# Patient Record
Sex: Male | Born: 1960 | Race: White | Hispanic: No | Smoking: Never smoker
Health system: Southern US, Community
[De-identification: ages and names within clinical notes are randomized; demographics above are authoritative.]

## PROBLEM LIST (undated history)

## (undated) DIAGNOSIS — E559 Vitamin D deficiency, unspecified: Secondary | ICD-10-CM

## (undated) DIAGNOSIS — K219 Gastro-esophageal reflux disease without esophagitis: Secondary | ICD-10-CM

## (undated) DIAGNOSIS — M1990 Unspecified osteoarthritis, unspecified site: Secondary | ICD-10-CM

## (undated) DIAGNOSIS — E785 Hyperlipidemia, unspecified: Secondary | ICD-10-CM

## (undated) DIAGNOSIS — H409 Unspecified glaucoma: Secondary | ICD-10-CM

## (undated) DIAGNOSIS — D649 Anemia, unspecified: Secondary | ICD-10-CM

## (undated) HISTORY — DX: Unspecified osteoarthritis, unspecified site: M19.90

## (undated) HISTORY — PX: SPINAL FUSION: SHX223

## (undated) HISTORY — PX: HAND SURGERY: SHX662

## (undated) HISTORY — DX: Hyperlipidemia, unspecified: E78.5

## (undated) HISTORY — PX: RHINOPLASTY: SUR1284

## (undated) HISTORY — DX: Gastro-esophageal reflux disease without esophagitis: K21.9

## (undated) HISTORY — DX: Unspecified glaucoma: H40.9

## (undated) HISTORY — PX: NECK SURGERY: SHX720

## (undated) HISTORY — DX: Anemia, unspecified: D64.9

## (undated) HISTORY — DX: Vitamin D deficiency, unspecified: E55.9

---

## 2001-08-12 ENCOUNTER — Encounter: Payer: Self-pay | Admitting: Neurosurgery

## 2001-08-12 ENCOUNTER — Encounter: Admission: RE | Admit: 2001-08-12 | Discharge: 2001-08-12 | Payer: Self-pay | Admitting: Neurosurgery

## 2001-08-23 ENCOUNTER — Encounter: Admission: RE | Admit: 2001-08-23 | Discharge: 2001-08-23 | Payer: Self-pay | Admitting: Neurosurgery

## 2001-08-23 ENCOUNTER — Encounter: Payer: Self-pay | Admitting: Neurosurgery

## 2002-01-10 ENCOUNTER — Encounter: Payer: Self-pay | Admitting: Neurosurgery

## 2002-01-10 ENCOUNTER — Encounter: Admission: RE | Admit: 2002-01-10 | Discharge: 2002-01-10 | Payer: Self-pay | Admitting: Neurosurgery

## 2002-03-21 ENCOUNTER — Encounter: Admission: RE | Admit: 2002-03-21 | Discharge: 2002-03-21 | Payer: Self-pay | Admitting: Neurosurgery

## 2002-03-21 ENCOUNTER — Encounter: Payer: Self-pay | Admitting: Neurosurgery

## 2002-04-01 ENCOUNTER — Encounter: Payer: Self-pay | Admitting: Neurosurgery

## 2002-04-01 ENCOUNTER — Encounter: Admission: RE | Admit: 2002-04-01 | Discharge: 2002-04-01 | Payer: Self-pay | Admitting: Neurosurgery

## 2002-07-28 ENCOUNTER — Ambulatory Visit (HOSPITAL_COMMUNITY): Admission: RE | Admit: 2002-07-28 | Discharge: 2002-07-29 | Payer: Self-pay | Admitting: Neurosurgery

## 2002-07-28 ENCOUNTER — Encounter: Payer: Self-pay | Admitting: Neurosurgery

## 2003-01-31 ENCOUNTER — Encounter: Admission: RE | Admit: 2003-01-31 | Discharge: 2003-01-31 | Payer: Self-pay | Admitting: Family Medicine

## 2003-01-31 ENCOUNTER — Encounter: Payer: Self-pay | Admitting: Family Medicine

## 2003-09-30 ENCOUNTER — Observation Stay (HOSPITAL_COMMUNITY): Admission: EM | Admit: 2003-09-30 | Discharge: 2003-10-01 | Payer: Self-pay | Admitting: Emergency Medicine

## 2003-11-07 ENCOUNTER — Ambulatory Visit (HOSPITAL_COMMUNITY): Admission: RE | Admit: 2003-11-07 | Discharge: 2003-11-07 | Payer: Self-pay | Admitting: Orthopedic Surgery

## 2003-11-07 ENCOUNTER — Ambulatory Visit (HOSPITAL_BASED_OUTPATIENT_CLINIC_OR_DEPARTMENT_OTHER): Admission: RE | Admit: 2003-11-07 | Discharge: 2003-11-07 | Payer: Self-pay | Admitting: Orthopedic Surgery

## 2004-03-08 ENCOUNTER — Ambulatory Visit (HOSPITAL_BASED_OUTPATIENT_CLINIC_OR_DEPARTMENT_OTHER): Admission: RE | Admit: 2004-03-08 | Discharge: 2004-03-08 | Payer: Self-pay | Admitting: *Deleted

## 2004-03-08 ENCOUNTER — Ambulatory Visit (HOSPITAL_COMMUNITY): Admission: RE | Admit: 2004-03-08 | Discharge: 2004-03-08 | Payer: Self-pay | Admitting: *Deleted

## 2005-04-22 ENCOUNTER — Ambulatory Visit (HOSPITAL_COMMUNITY): Admission: RE | Admit: 2005-04-22 | Discharge: 2005-04-22 | Payer: Self-pay | Admitting: Family Medicine

## 2011-11-05 ENCOUNTER — Encounter: Payer: Self-pay | Admitting: Internal Medicine

## 2011-11-13 ENCOUNTER — Encounter: Payer: Self-pay | Admitting: Internal Medicine

## 2011-11-14 ENCOUNTER — Encounter: Payer: Self-pay | Admitting: Internal Medicine

## 2011-11-14 ENCOUNTER — Ambulatory Visit (INDEPENDENT_AMBULATORY_CARE_PROVIDER_SITE_OTHER): Payer: 59 | Admitting: Internal Medicine

## 2011-11-14 VITALS — BP 120/64 | HR 80 | Ht 70.0 in | Wt 182.0 lb

## 2011-11-14 DIAGNOSIS — R195 Other fecal abnormalities: Secondary | ICD-10-CM

## 2011-11-14 DIAGNOSIS — D509 Iron deficiency anemia, unspecified: Secondary | ICD-10-CM

## 2011-11-14 DIAGNOSIS — K219 Gastro-esophageal reflux disease without esophagitis: Secondary | ICD-10-CM

## 2011-11-14 DIAGNOSIS — E785 Hyperlipidemia, unspecified: Secondary | ICD-10-CM | POA: Insufficient documentation

## 2011-11-14 MED ORDER — PEG-KCL-NACL-NASULF-NA ASC-C 100 G PO SOLR: 1.0000 | Freq: Once | ORAL | Status: DC

## 2011-11-14 NOTE — Patient Instructions (Signed)
You have been scheduled for a endoscopy/colonoscopy with propofol. Please follow written instructions given to you at your visit today.  Please pick up your prep kit at the pharmacy within the next 1-3 days.  We have sent the following medications to your pharmacy for you to pick up at your convenience: Moviprep, you were given instructions at your visit today.

## 2011-11-14 NOTE — Progress Notes (Signed)
Subjective:    Patient ID: Joseph Oliver, male    DOB: 01/24/1961, 51 y.o.   MRN: [id number]  HPI Joseph Oliver is a 51 yo male with PMH of GERD, hyperlipidemia and anemia who seen in consultation at request of Dr. Christell Constant for evaluation of positive FOBT. The patient also has a history of iron deficiency anemia, which she reports was diagnosed 2 years ago. He has been on oral iron replacement therapy for that time. I do not have his recent labs, but he reports improvement in his iron levels. He has not seen any evidence of blood in his stools. He denies diarrhea or constipation. He very rarely will have lower abdominal pain, which is usually relieved with defecation. He does have a history of heartburn for 7 or 8 years. This is usually well-controlled on Zantac 75 mg twice a day. He has no dysphagia, nausea or vomiting. No fevers or chills.  Review of Systems As per history of present illness, otherwise negative  Past Medical History  Diagnosis Date  . Anemia     Hx of   . GERD (gastroesophageal reflux disease)   . Hyperlipemia    Past Surgical History  Procedure Date  . Rhinoplasty   . Spinal fusion   . Neck surgery   . Hand surgery     Left    Current Outpatient Prescriptions  Medication Sig Dispense Refill  . calcium-vitamin D (OSCAL 500/200 D-3) 500-200 MG-UNIT per tablet Take 1 tablet by mouth daily.      . Ferrous Sulfate (IRON) 325 (65 FE) MG TABS Take 1 tablet by mouth daily.      . Multiple Vitamins-Minerals (CENTRUM SILVER PO) Take 1 capsule by mouth daily.      . ranitidine (ZANTAC) 75 MG tablet Take 75 mg by mouth 2 (two) times daily.      Marland Kitchen VITAMIN D, CHOLECALCIFEROL, PO Take by mouth as directed.      . peg 3350 powder (MOVIPREP) 100 G SOLR Take 1 kit (100 g total) by mouth once.  1 kit  0   No Known Allergies  Family History  Problem Relation Age of Onset  . Colon cancer Neg Hx    History  Substance Use Topics  . Smoking status: Never Smoker   . Smokeless  tobacco: Never Used  . Alcohol Use: Yes     3 drinks daily       Objective:   Physical Exam BP 120/64  Pulse 80  Ht 5\' 10"  (1.778 m)  Wt 182 lb (82.555 kg)  BMI 26.11 kg/m2 Constitutional: Well-developed and well-nourished. No distress. HEENT: Normocephalic and atraumatic. Oropharynx is clear and moist. No oropharyngeal exudate. Conjunctivae are normal. Pupils are equal round and reactive to light. No scleral icterus. Neck: Neck supple. Trachea midline. Cardiovascular: Normal rate, regular rhythm and intact distal pulses. No M/R/G Pulmonary/chest: Effort normal and breath sounds normal. No wheezing, rales or rhonchi. Abdominal: Soft, nontender, nondistended. Bowel sounds active throughout. There are no masses palpable. No hepatosplenomegaly. Extremities: no clubbing, cyanosis, or edema Lymphadenopathy: No cervical adenopathy noted. Neurological: Alert and oriented to person place and time. Skin: Skin is warm and dry. No rashes noted. Psychiatric: Normal mood and affect. Behavior is normal.     Assessment & Plan:   51 yo male with PMH of GERD, hyperlipidemia and anemia who seen in consultation at request of Dr. Christell Constant for evaluation of positive FOBT, also with hx of IDA (though records are pending).  1. +  FOBT -- this is age, his history of anemia which may be iron deficient, and his positive FOBT I recommended colonoscopy. We discussed the risks and benefits today and he is agreeable to proceed.  2. GERD -- patient has a long-standing history of GERD which is controlled at present on twice a day ranitidine therapy. He'll continue this therapy for now. Given his long-standing history of GERD, the fact that he is a middle-aged Caucasian male, we will perform upper endoscopy at the time of his colonoscopy for a Barrett's screening. Upper endoscopy would also be indicated given his iron deficiency anemia.

## 2011-11-17 ENCOUNTER — Encounter: Payer: Self-pay | Admitting: Internal Medicine

## 2011-11-26 ENCOUNTER — Ambulatory Visit: Payer: Self-pay | Admitting: Internal Medicine

## 2011-11-27 ENCOUNTER — Ambulatory Visit (AMBULATORY_SURGERY_CENTER): Payer: BC Managed Care – PPO | Admitting: Internal Medicine

## 2011-11-27 ENCOUNTER — Encounter: Payer: Self-pay | Admitting: Internal Medicine

## 2011-11-27 VITALS — BP 119/63 | HR 66 | Temp 98.4°F | Resp 12 | Ht 70.0 in | Wt 182.0 lb

## 2011-11-27 DIAGNOSIS — D509 Iron deficiency anemia, unspecified: Secondary | ICD-10-CM

## 2011-11-27 DIAGNOSIS — D1330 Benign neoplasm of unspecified part of small intestine: Secondary | ICD-10-CM

## 2011-11-27 DIAGNOSIS — K319 Disease of stomach and duodenum, unspecified: Secondary | ICD-10-CM

## 2011-11-27 DIAGNOSIS — K219 Gastro-esophageal reflux disease without esophagitis: Secondary | ICD-10-CM

## 2011-11-27 DIAGNOSIS — K2970 Gastritis, unspecified, without bleeding: Secondary | ICD-10-CM

## 2011-11-27 DIAGNOSIS — K635 Polyp of colon: Secondary | ICD-10-CM

## 2011-11-27 DIAGNOSIS — D126 Benign neoplasm of colon, unspecified: Secondary | ICD-10-CM

## 2011-11-27 MED ORDER — SODIUM CHLORIDE 0.9 % IV SOLN: 500.0000 mL | INTRAVENOUS | Status: DC

## 2011-11-27 NOTE — Op Note (Signed)
[facility] [address] [place], [place]  [address]  COLONOSCOPY PROCEDURE REPORT  PATIENT:  Joseph, Oliver  MR#:  [id number] BIRTHDATE:  [birth year], 50 yrs. old  GENDER:  male ENDOSCOPIST:  Carie Caddy. Ronny Ruddell, MD REF. BY:  Rudi Heap, M.D. PROCEDURE DATE:  11/27/2011 PROCEDURE:  Colonoscopy with snare polypectomy, Colon with cold biopsy polypectomy ASA CLASS:  Class I INDICATIONS:  FOBT positive stool, Iron deficiency anemia, 1st colonoscopy MEDICATIONS:   MAC sedation, administered by CRNA, propofol (Diprivan) 250 mg IV  DESCRIPTION OF PROCEDURE:   After the risks benefits and alternatives of the procedure were thoroughly explained, informed consent was obtained.  Digital rectal exam was performed and revealed no rectal masses.   The LB CF-H180AL [id number] endoscope was introduced through the anus and advanced to the terminal ileum which was intubated for a short distance, without limitations. The quality of the prep was good, using MoviPrep.  The instrument was then slowly withdrawn as the colon was fully examined. <<PROCEDUREIMAGES>>  FINDINGS:  The terminal ileum appeared normal.  A 6 mm sessile polyp was found in the cecum. Polyp was snared without cautery. Retrieval was successful.   A 5 mm sessile polyp was found in the transverse colon. Polyp was snared without cautery. Retrieval was successful. An 8 mm sessile polyp was found in the descending colon. Polyp was snared without cautery. Retrieval was successful. A 2 mm sessile polyp was found in the sigmoid colon. The polyp was removed using cold biopsy forceps.  A 6 mm pedunculated polyp was found in the sigmoid colon. Polyp was snared, then cauterized with monopolar cautery. Retrieval was successful. Retroflexed views in the rectum revealed no abnormalities.    The scope was then withdrawn from the cecum and the procedure completed.  COMPLICATIONS:  None  ENDOSCOPIC IMPRESSION: 1) Normal terminal  ileum 2) Sessile polyp in the cecum. Removed and sent to pathology. 3) Sessile polyp in the transverse colon. Removed and sent to pathology. 4) Sessile polyp in the descending colon. Removed and sent to pathology. 5) Sessile polyp in the sigmoid colon. Removed and sent to pathology. 6) Pedunculated polyp in the sigmoid colon. Removed and sent to pathology.  RECOMMENDATIONS: 1) Hold aspirin, aspirin products, and anti-inflammatory medication for 1 week. 2) Await pathology results 3) If the polyps removed today are proven to be adenomatous (pre-cancerous) polyps, you will need a colonoscopy in 3 years. Otherwise you should continue to follow colorectal cancer screening guidelines for "routine risk" patients with a colonoscopy in 10 years. You will receive a letter within 1-2 weeks with the results of your biopsy as well as final recommendations. Please call my office if you have not received a letter after 3 weeks.  Carie Caddy. Rhea Belton, MD  CC:  The Patient Rudi Heap, MD  n. Rosalie DoctorCarie Caddy. Zenola Dezarn at 11/27/2011 10:45 AM  Haynes Dage, [phone]

## 2011-11-27 NOTE — Op Note (Signed)
[facility] [address] [place], [place]  [address]  ENDOSCOPY PROCEDURE REPORT  PATIENT:  Joseph Oliver, Joseph Oliver  MR#:  [id number] BIRTHDATE:  1961/05/06, 50 yrs. old  GENDER:  male ENDOSCOPIST:  Carie Caddy. Pasqualino Witherspoon, MD Referred by:  Rudi Heap, M.D. PROCEDURE DATE:  11/27/2011 PROCEDURE:  EGD with biopsy for H. pylori 19147, EGD with biopsy, 43239 ASA CLASS:  Class I INDICATIONS:  GERD, iron deficiency anemia MEDICATIONS:   MAC sedation, administered by CRNA, propofol (Diprivan) 250 mg IV TOPICAL ANESTHETIC:  none  DESCRIPTION OF PROCEDURE:   After the risks benefits and alternatives of the procedure were thoroughly explained, informed consent was obtained.  The LB GIF-H180 [id number] endoscope was introduced through the mouth and advanced to the second portion of the duodenum, without limitations.  The instrument was slowly withdrawn as the mucosa was fully examined. <<PROCEDUREIMAGES>>  The esophagus and gastroesophageal junction were completely normal in appearance.  Mild gastritis with scattered erosions was found in the body and the antrum of the stomach. Biopsies of the antrum and body of the stomach were obtained and sent to pathology.  The duodenal bulb was normal in appearance, as was the postbulbar duodenum. Multiple biopsies were obtained and sent to pathology given iron deficiency.    Retroflexed views revealed no abnormalities.    The scope was then withdrawn from the patient and the procedure completed.  COMPLICATIONS:  None  ENDOSCOPIC IMPRESSION: 1) Normal esophagus 2) Mild gastritis with scattered erosions in the body and the antrum of the stomach.  Biopsies performed. 3) Normal duodenum. Biopsies performed.  RECOMMENDATIONS: 1) Await pathology results 2) Follow-up of helicobacter pylori status, treat if indicated 3) Avoid NSAIDs 4) Continue current medications.  Carie Caddy. Rhea Belton, MD  CC:  The Patient Rudi Heap, MD  n. Rosalie DoctorCarie Caddy. Edita Weyenberg  at 11/27/2011 10:39 AM  Haynes Dage, [phone]

## 2011-11-27 NOTE — Patient Instructions (Addendum)

## 2011-11-27 NOTE — Progress Notes (Signed)
Patient did not experience any of the following events: a burn prior to discharge; a fall within the facility; wrong site/side/patient/procedure/implant event; or a hospital transfer or hospital admission upon discharge from the facility. (G8907) Patient did not have preoperative order for IV antibiotic SSI prophylaxis. (G8918)  

## 2011-11-28 ENCOUNTER — Telehealth: Payer: Self-pay | Admitting: *Deleted

## 2011-11-28 NOTE — Telephone Encounter (Signed)
  Follow up Call-  Call back number 11/27/2011  Post procedure Call Back phone  # [phone]  Permission to leave phone message Yes     Patient questions:  Do you have a fever, pain , or abdominal swelling? no Pain Score  0 *  Have you tolerated food without any problems? yes  Have you been able to return to your normal activities? yes  Do you have any questions about your discharge instructions: Diet   no Medications  no Follow up visit  no  Do you have questions or concerns about your Care? no  Actions: * If pain score is 4 or above: No action needed, pain <4.

## 2011-12-05 ENCOUNTER — Telehealth: Payer: Self-pay | Admitting: Internal Medicine

## 2011-12-05 NOTE — Telephone Encounter (Signed)
Explained that Dr Rhea Belton is not here, but gave him my interpretation of the results. Informed him he will receive a letter from Dr Rhea Belton explaining why the polyps will need more surveillance as far the time for recall procedures; pt stated understanding.

## 2011-12-09 ENCOUNTER — Encounter: Payer: Self-pay | Admitting: Internal Medicine

## 2012-10-08 ENCOUNTER — Telehealth: Payer: Self-pay | Admitting: Nurse Practitioner

## 2012-10-08 NOTE — Telephone Encounter (Signed)
Sat appt made.

## 2012-10-09 ENCOUNTER — Ambulatory Visit (INDEPENDENT_AMBULATORY_CARE_PROVIDER_SITE_OTHER): Payer: BC Managed Care – PPO | Admitting: Nurse Practitioner

## 2012-10-09 ENCOUNTER — Encounter: Payer: Self-pay | Admitting: Nurse Practitioner

## 2012-10-09 VITALS — BP 112/73 | HR 55 | Temp 98.8°F | Ht 70.0 in | Wt 183.0 lb

## 2012-10-09 DIAGNOSIS — J209 Acute bronchitis, unspecified: Secondary | ICD-10-CM

## 2012-10-09 MED ORDER — AZITHROMYCIN 250 MG PO TABS: ORAL_TABLET | ORAL | Status: DC

## 2012-10-09 MED ORDER — HYDROCODONE-HOMATROPINE 5-1.5 MG/5ML PO SYRP: 5.0000 mL | ORAL_SOLUTION | Freq: Three times a day (TID) | ORAL | Status: DC | PRN

## 2012-10-09 NOTE — Progress Notes (Signed)
  Subjective:    Patient ID: Joseph Oliver, male    DOB: Aug 01, 1960, 52 y.o.   MRN: [id number]  HPI-Patient in complaining of couugh . Started 4day. Has gotten unchanged since started. Associated symptoms include Was on a plane with a lot of sick people. He has tried Mucinex OTC without relief.     Review of Systems  Constitutional: Negative for fever and chills.  HENT: Positive for congestion, rhinorrhea, postnasal drip and sinus pressure. Negative for ear pain.   Respiratory: Positive for cough (nonproductive).        Objective:   Physical Exam  Constitutional: He is oriented to person, place, and time. He appears well-developed and well-nourished.  HENT:  Right Ear: Tympanic membrane is not erythematous.  Left Ear: Tympanic membrane is not erythematous.  Nose: Mucosal edema and rhinorrhea present.  Mouth/Throat: Posterior oropharyngeal erythema present.  Cardiovascular: Normal rate and normal heart sounds.   Pulmonary/Chest: Effort normal. He has wheezes (when coughing).  Neurological: He is alert and oriented to person, place, and time.  Skin: Skin is warm and dry.   BP 112/73  Pulse 55  Temp(Src) 98.8 F (37.1 C) (Oral)  Ht 5\' 10"  (1.778 m)  Wt 183 lb (83.008 kg)  BMI 26.26 kg/m2        Assessment & Plan:  1. Acute bronchitis 1. Take meds as prescribed 2. Use a cool mist humidifier especially during the winter months and when heat has  been humid. 3. Use saline nose sprays frequently 4. Saline irrigations of the nose can be very helpful if done frequently.  * 4X daily for 1 week*  * Use of a nettie pot can be helpful with this. Follow directions with this* 5. Drink plenty of fluids 6. Keep thermostat turn down low 7.For any cough or congestion  Use plain Mucinex- regular strength or max strength is fine   * Children- consult with Pharmacist for dosing 8. For fever or aces or pains- take tylenol or ibuprofen appropriate for age and weight.  * for fevers  greater than 101 orally you may alternate ibuprofen and tylenol every  3 hours.   - azithromycin (ZITHROMAX Z-PAK) 250 MG tablet; As directed  Dispense: 6 each; Refill: 0 - HYDROcodone-homatropine (HYCODAN) 5-1.5 MG/5ML syrup; Take 5 mLs by mouth every 8 (eight) hours as needed for cough.  Dispense: 120 mL; Refill: 0  Mary-Margaret Daphine Deutscher, FNP

## 2012-10-09 NOTE — Patient Instructions (Signed)

## 2012-10-20 ENCOUNTER — Telehealth: Payer: Self-pay | Admitting: Nurse Practitioner

## 2012-10-20 ENCOUNTER — Ambulatory Visit (INDEPENDENT_AMBULATORY_CARE_PROVIDER_SITE_OTHER): Payer: BC Managed Care – PPO | Admitting: General Practice

## 2012-10-20 ENCOUNTER — Encounter: Payer: Self-pay | Admitting: General Practice

## 2012-10-20 VITALS — BP 115/68 | HR 73 | Temp 98.9°F | Ht 70.0 in | Wt 184.0 lb

## 2012-10-20 DIAGNOSIS — L0390 Cellulitis, unspecified: Secondary | ICD-10-CM

## 2012-10-20 DIAGNOSIS — W57XXXA Bitten or stung by nonvenomous insect and other nonvenomous arthropods, initial encounter: Secondary | ICD-10-CM

## 2012-10-20 DIAGNOSIS — L0291 Cutaneous abscess, unspecified: Secondary | ICD-10-CM

## 2012-10-20 DIAGNOSIS — T148 Other injury of unspecified body region: Secondary | ICD-10-CM

## 2012-10-20 MED ORDER — DOXYCYCLINE HYCLATE 100 MG PO TABS: ORAL_TABLET | ORAL | Status: DC

## 2012-10-20 NOTE — Telephone Encounter (Signed)
appt given for today with Philomena Doheny, FNP

## 2012-10-20 NOTE — Patient Instructions (Signed)
Deer Tick Bite Deer ticks are brown arachnids (spider family) that vary in size from as small as the head of a pin to 1/4 inch (1/2 cm) diameter. They thrive in wooded areas. Deer are the preferred host of adult deer ticks. Small rodents are the host of young ticks (nymphs). When a person walks in a field or wooded area, young and adult ticks in the surrounding grass and vegetation can attach themselves to the skin. They can suck blood for hours to days if unnoticed. Ticks are found all over the [place] Some ticks carry a specific bacteria (Borrelia burgdorferi) that causes an infection called Lyme disease. The bacteria is typically passed into a person during the blood sucking process. This happens after the tick has been attached for at least a number of hours. While ticks can be found all over the [place] those carrying the bacteria that causes Lyme disease are most common in [place] and the [place]. Only a small proportion of ticks in these areas carry the Lyme disease bacteria and cause human infections. Ticks usually attach to warm spots on the body, such as the:  Head.  Back.  Neck.  Armpits.  Groin. SYMPTOMS  Most of the time, a deer tick bite will not be felt. You may or may not see the attached tick. You may notice mild irritation or redness around the bite site. If the deer tick passes the Lyme disease bacteria to a person, a round, red rash may be noticed 2 to 3 days after the bite. The rash may be clear in the middle, like a bull's-eye or target. If not treated, other symptoms may develop several days to weeks after the onset of the rash. These symptoms may include:  New rash lesions.  Fatigue and weakness.  General ill feeling and achiness.  Chills.  Headache and neck pain.  Swollen lymph glands.  Sore muscles and joints. 5 to 15% of untreated people with Lyme disease may develop more severe illnesses after several weeks to months. This may include inflammation of the  brain lining (meningitis), nerve palsies, an abnormal heartbeat, or severe muscle and joint pain and inflammation (myositis or arthritis). DIAGNOSIS   Physical exam and medical history.  Viewing the tick if it was saved for confirmation.  Blood tests (to check or confirm the presence of Lyme disease). TREATMENT  Most ticks do not carry disease. If found, an attached tick should be removed using tweezers. Tweezers should be placed under the body of the tick so it is removed by its attachment parts (pincers). If there are signs or symptoms of being sick, or Lyme disease is confirmed, medicines (antibiotics) that kill germs are usually prescribed. In more severe cases, antibiotics may be given through an intravenous (IV) access. HOME CARE INSTRUCTIONS   Always remove ticks with tweezers. Do not use petroleum jelly or other methods to kill or remove the tick. Slide the tweezers under the body and pull out as much as you can. If you are not sure what it is, save it in a jar and show your caregiver.  Once you remove the tick, the skin will heal on its own. Wash your hands and the affected area with water and soap. You may place a bandage on the affected area.  Take medicine as directed. You may be advised to take a full course of antibiotics.  Follow up with your caregiver as recommended. FINDING OUT THE RESULTS OF YOUR TEST Not all test results are available   during your visit. If your test results are not back during the visit, make an appointment with your caregiver to find out the results. Do not assume everything is normal if you have not heard from your caregiver or the medical facility. It is important for you to follow up on all of your test results. PROGNOSIS  If Lyme disease is confirmed, early treatment with antibiotics is very effective. Following preventive guidelines is important since it is possible to get the disease more than once. PREVENTION   Wear long sleeves and long pants in  wooded or grassy areas. Tuck your pants into your socks.  Use an insect repellent while hiking.  Check yourself, your children, and your pets regularly for ticks after playing outside.  Clear piles of leaves or brush from your yard. Ticks might live there. SEEK MEDICAL CARE IF:   You or your child has an oral temperature above 102 F (38.9 C).  You develop a severe headache following the bite.  You feel generally ill.  You notice a rash.  You are having trouble removing the tick.  The bite area has red skin or yellow drainage. SEEK IMMEDIATE MEDICAL CARE IF:   Your face is weak and droopy or you have other neurological symptoms.  You have severe joint pain or weakness. MAKE SURE YOU:   Understand these instructions.  Will watch your condition.  Will get help right away if you are not doing well or get worse. FOR MORE INFORMATION Centers for Disease Control and Prevention: FootballExhibition.com.br American Academy [employer]: www.https://powers.com/ Document Released: 08/27/2009 Document Revised: 08/25/2011 Document Reviewed: 08/27/2009 [facility] Patient Information 2013 [place], [place]. Cellulitis Cellulitis is an infection of the skin and the tissue beneath it. The infected area is usually red and tender. Cellulitis occurs most often in the arms and lower legs.  CAUSES  Cellulitis is caused by bacteria that enter the skin through cracks or cuts in the skin. The most common types of bacteria that cause cellulitis are Staphylococcus and Streptococcus. SYMPTOMS   Redness and warmth.  Swelling.  Tenderness or pain.  Fever. DIAGNOSIS  Your caregiver can usually determine what is wrong based on a physical exam. Blood tests may also be done. TREATMENT  Treatment usually involves taking an antibiotic medicine. HOME CARE INSTRUCTIONS   Take your antibiotics as directed. Finish them even if you start to feel better.  Keep the infected arm or leg elevated to reduce  swelling.  Apply a warm cloth to the affected area up to 4 times per day to relieve pain.  Only take over-the-counter or prescription medicines for pain, discomfort, or fever as directed by your caregiver.  Keep all follow-up appointments as directed by your caregiver. SEEK MEDICAL CARE IF:   You notice red streaks coming from the infected area.  Your red area gets larger or turns dark in color.  Your bone or joint underneath the infected area becomes painful after the skin has healed.  Your infection returns in the same area or another area.  You notice a swollen bump in the infected area.  You develop new symptoms. SEEK IMMEDIATE MEDICAL CARE IF:   You have a fever.  You feel very sleepy.  You develop vomiting or diarrhea.  You have a general ill feeling (malaise) with muscle aches and pains. MAKE SURE YOU:   Understand these instructions.  Will watch your condition.  Will get help right away if you are not doing well or get worse. Document Released:  03/12/2005 Document Revised: 12/02/2011 Document Reviewed: 08/18/2011 [facility] Patient Information 2013 [place], [place].

## 2012-10-20 NOTE — Progress Notes (Signed)
  Subjective:    Patient ID: Joseph Oliver, male    DOB: 1961/01/29, 52 y.o.   MRN: [id number]  HPI Presents today for tick bite. Onset was Thursday of last week. Reports removing entire tick.     Review of Systems  Constitutional: Negative for fever and chills.  Respiratory: Negative for chest tightness and shortness of breath.   Cardiovascular: Negative for chest pain.  Genitourinary: Negative for difficulty urinating.  Skin:       Itching   Neurological: Negative for dizziness and headaches.       Objective:   Physical Exam  Constitutional: He is oriented to person, place, and time. He appears well-developed and well-nourished.  Cardiovascular: Normal rate, regular rhythm and normal heart sounds.   Pulmonary/Chest: Effort normal and breath sounds normal. No respiratory distress. He exhibits no tenderness.  Neurological: He is alert and oriented to person, place, and time.  Skin: Skin is warm and dry. There is erythema.  1/4 x 1/4 inch erythematous, firm area to right upper back  Psychiatric: He has a normal mood and affect.          Assessment & Plan:  Take antibiotics until completed Keep and dry, refrain from scratching RTO if symptoms worsen Patient verbalized understanding Coralie Keens, FNP-C

## 2012-11-23 ENCOUNTER — Telehealth: Payer: Self-pay | Admitting: Family Medicine

## 2012-11-23 DIAGNOSIS — E559 Vitamin D deficiency, unspecified: Secondary | ICD-10-CM

## 2012-11-23 DIAGNOSIS — E785 Hyperlipidemia, unspecified: Secondary | ICD-10-CM

## 2012-11-23 DIAGNOSIS — I10 Essential (primary) hypertension: Secondary | ICD-10-CM

## 2012-11-25 ENCOUNTER — Other Ambulatory Visit (INDEPENDENT_AMBULATORY_CARE_PROVIDER_SITE_OTHER): Payer: BC Managed Care – PPO

## 2012-11-25 DIAGNOSIS — E559 Vitamin D deficiency, unspecified: Secondary | ICD-10-CM

## 2012-11-25 DIAGNOSIS — E785 Hyperlipidemia, unspecified: Secondary | ICD-10-CM

## 2012-11-25 DIAGNOSIS — Z0000 Encounter for general adult medical examination without abnormal findings: Secondary | ICD-10-CM

## 2012-11-25 DIAGNOSIS — Z79899 Other long term (current) drug therapy: Secondary | ICD-10-CM

## 2012-11-25 LAB — POCT CBC
Granulocyte percent: 57.1 %G (ref 37–80)
Hemoglobin: 15.3 g/dL (ref 14.1–18.1)
Lymph, poc: 2.5 (ref 0.6–3.4)
MPV: 8.2 fL (ref 0–99.8)
POC Granulocyte: 4.6 (ref 2–6.9)
POC LYMPH PERCENT: 30.5 %L (ref 10–50)
Platelet Count, POC: 257 10*3/uL (ref 142–424)
RBC: 5.1 M/uL (ref 4.69–6.13)

## 2012-11-25 LAB — BASIC METABOLIC PANEL WITH GFR
CO2: 29 mEq/L (ref 19–32)
Calcium: 9.5 mg/dL (ref 8.4–10.5)
Chloride: 104 mEq/L (ref 96–112)
Creat: 1.01 mg/dL (ref 0.50–1.35)
GFR, Est Non African American: 85 mL/min
Sodium: 142 mEq/L (ref 135–145)

## 2012-11-25 LAB — HEPATIC FUNCTION PANEL
Alkaline Phosphatase: 71 U/L (ref 39–117)
Indirect Bilirubin: 0.5 mg/dL (ref 0.0–0.9)
Total Bilirubin: 0.6 mg/dL (ref 0.3–1.2)

## 2012-11-25 NOTE — Progress Notes (Signed)
Pt here prior to visit

## 2012-11-26 LAB — NMR LIPOPROFILE WITH LIPIDS
HDL Particle Number: 37.7 umol/L (ref >=30.5)
HDL Size: 8.6 nm — ABNORMAL LOW (ref >=9.2)
HDL-C: 59 mg/dL (ref >=40)
LDL (calc): 137 mg/dL — ABNORMAL HIGH (ref <100)
LDL Size: 21.2 nm (ref >20.5)
LP-IR Score: 49 — ABNORMAL HIGH (ref <=45)
Large HDL-P: 3.1 umol/L — ABNORMAL LOW (ref >=4.8)
Triglycerides: 64 mg/dL (ref <150)

## 2012-11-26 LAB — VITAMIN D 25 HYDROXY (VIT D DEFICIENCY, FRACTURES): Vit D, 25-Hydroxy: 66 ng/mL (ref 30–89)

## 2012-12-01 NOTE — Telephone Encounter (Signed)
Pt aware of labs  

## 2012-12-01 NOTE — Telephone Encounter (Signed)
orders put in for labs

## 2012-12-06 ENCOUNTER — Encounter: Payer: Self-pay | Admitting: *Deleted

## 2012-12-06 ENCOUNTER — Encounter: Payer: Self-pay | Admitting: General Practice

## 2012-12-06 ENCOUNTER — Ambulatory Visit (INDEPENDENT_AMBULATORY_CARE_PROVIDER_SITE_OTHER): Payer: BC Managed Care – PPO | Admitting: General Practice

## 2012-12-06 ENCOUNTER — Telehealth: Payer: Self-pay | Admitting: General Practice

## 2012-12-06 VITALS — BP 115/73 | HR 61 | Temp 98.5°F | Ht 70.0 in | Wt 187.5 lb

## 2012-12-06 DIAGNOSIS — S30861A Insect bite (nonvenomous) of abdominal wall, initial encounter: Secondary | ICD-10-CM

## 2012-12-06 DIAGNOSIS — S30860A Insect bite (nonvenomous) of lower back and pelvis, initial encounter: Secondary | ICD-10-CM

## 2012-12-06 DIAGNOSIS — W57XXXA Bitten or stung by nonvenomous insect and other nonvenomous arthropods, initial encounter: Secondary | ICD-10-CM

## 2012-12-06 NOTE — Telephone Encounter (Signed)
appt made

## 2012-12-06 NOTE — Patient Instructions (Addendum)
Deer Tick Bite Deer ticks are brown arachnids (spider family) that vary in size from as small as the head of a pin to 1/4 inch (1/2 cm) diameter. They thrive in wooded areas. Deer are the preferred host of adult deer ticks. Small rodents are the host of young ticks (nymphs). When a person walks in a field or wooded area, young and adult ticks in the surrounding grass and vegetation can attach themselves to the skin. They can suck blood for hours to days if unnoticed. Ticks are found all over the [place] Some ticks carry a specific bacteria (Borrelia burgdorferi) that causes an infection called Lyme disease. The bacteria is typically passed into a person during the blood sucking process. This happens after the tick has been attached for at least a number of hours. While ticks can be found all over the [place] those carrying the bacteria that causes Lyme disease are most common in [place] and the [place]. Only a small proportion of ticks in these areas carry the Lyme disease bacteria and cause human infections. Ticks usually attach to warm spots on the body, such as the:  Head.  Back.  Neck.  Armpits.  Groin. SYMPTOMS  Most of the time, a deer tick bite will not be felt. You may or may not see the attached tick. You may notice mild irritation or redness around the bite site. If the deer tick passes the Lyme disease bacteria to a person, a round, red rash may be noticed 2 to 3 days after the bite. The rash may be clear in the middle, like a bull's-eye or target. If not treated, other symptoms may develop several days to weeks after the onset of the rash. These symptoms may include:  New rash lesions.  Fatigue and weakness.  General ill feeling and achiness.  Chills.  Headache and neck pain.  Swollen lymph glands.  Sore muscles and joints. 5 to 15% of untreated people with Lyme disease may develop more severe illnesses after several weeks to months. This may include inflammation of the  brain lining (meningitis), nerve palsies, an abnormal heartbeat, or severe muscle and joint pain and inflammation (myositis or arthritis). DIAGNOSIS   Physical exam and medical history.  Viewing the tick if it was saved for confirmation.  Blood tests (to check or confirm the presence of Lyme disease). TREATMENT  Most ticks do not carry disease. If found, an attached tick should be removed using tweezers. Tweezers should be placed under the body of the tick so it is removed by its attachment parts (pincers). If there are signs or symptoms of being sick, or Lyme disease is confirmed, medicines (antibiotics) that kill germs are usually prescribed. In more severe cases, antibiotics may be given through an intravenous (IV) access. HOME CARE INSTRUCTIONS   Always remove ticks with tweezers. Do not use petroleum jelly or other methods to kill or remove the tick. Slide the tweezers under the body and pull out as much as you can. If you are not sure what it is, save it in a jar and show your caregiver.  Once you remove the tick, the skin will heal on its own. Wash your hands and the affected area with water and soap. You may place a bandage on the affected area.  Take medicine as directed. You may be advised to take a full course of antibiotics.  Follow up with your caregiver as recommended. FINDING OUT THE RESULTS OF YOUR TEST Not all test results are available   during your visit. If your test results are not back during the visit, make an appointment with your caregiver to find out the results. Do not assume everything is normal if you have not heard from your caregiver or the medical facility. It is important for you to follow up on all of your test results. PROGNOSIS  If Lyme disease is confirmed, early treatment with antibiotics is very effective. Following preventive guidelines is important since it is possible to get the disease more than once. PREVENTION   Wear long sleeves and long pants in  wooded or grassy areas. Tuck your pants into your socks.  Use an insect repellent while hiking.  Check yourself, your children, and your pets regularly for ticks after playing outside.  Clear piles of leaves or brush from your yard. Ticks might live there. SEEK MEDICAL CARE IF:   You or your child has an oral temperature above 102 F (38.9 C).  You develop a severe headache following the bite.  You feel generally ill.  You notice a rash.  You are having trouble removing the tick.  The bite area has red skin or yellow drainage. SEEK IMMEDIATE MEDICAL CARE IF:   Your face is weak and droopy or you have other neurological symptoms.  You have severe joint pain or weakness. MAKE SURE YOU:   Understand these instructions.  Will watch your condition.  Will get help right away if you are not doing well or get worse. FOR MORE INFORMATION Centers for Disease Control and Prevention: www.cdc.gov American Academy [employer]icians: www.aafp.org Document Released: 08/27/2009 Document Revised: 08/25/2011 Document Reviewed: 08/27/2009 [employer] Patient Information 2014 [employer].  

## 2012-12-06 NOTE — Progress Notes (Addendum)
  Subjective:    Patient ID: Joseph Oliver, male    DOB: 05/25/1961, 52 y.o.   MRN: [id number]  HPI Presents today for follow up of tick bite on Sunday. He reports his girlfriend attempted to remove tick and wasn't able to remove fully intact. He reports the tick was in his umbilical area. He reports beginning starting doxycycline on Sunday, it was a prescription that was written a few weeks ago for tick bite that he never took.     Review of Systems  Constitutional: Negative for fever and chills.  Respiratory: Negative for chest tightness and shortness of breath.   Cardiovascular: Negative for chest pain and palpitations.  Gastrointestinal: Negative for abdominal pain.  Genitourinary: Negative for dysuria, hematuria and difficulty urinating.  Skin:       Pinpoint red area noted to umbilical area  Neurological: Negative for dizziness, weakness and headaches.       Objective:   Physical Exam  Constitutional: He is oriented to person, place, and time. He appears well-developed and well-nourished.  Cardiovascular: Normal rate, regular rhythm and normal heart sounds.   Pulmonary/Chest: Effort normal and breath sounds normal. No respiratory distress. He exhibits no tenderness.  Neurological: He is alert and oriented to person, place, and time.  Skin: Skin is warm and dry.  Pinpoint erythematous area noted to umbilical area. Negative for drainage.  Psychiatric: He has a normal mood and affect.          Assessment & Plan:  1. Tick bite of abdomen, initial encounter -Cleansed umbilical area with alcohol, removed remainder of tick, cleansed again with normal saline/peroxide, patient tolerated well -Continue doxycycline as directed -keep area clean and dry -Monitor for signs of infection -RTO if symptoms worsen or seek emergency medical treatment -Patient verbalized understanding -Coralie Keens, FNP-C

## 2012-12-14 ENCOUNTER — Encounter: Payer: Self-pay | Admitting: *Deleted

## 2012-12-24 NOTE — Telephone Encounter (Signed)
A BMP and a hepatic lab was ordered on 11-24-10 and a final has been sent

## 2013-01-18 ENCOUNTER — Telehealth: Payer: Self-pay | Admitting: Family Medicine

## 2013-01-18 NOTE — Telephone Encounter (Signed)
appt made

## 2013-01-19 ENCOUNTER — Encounter: Payer: Self-pay | Admitting: Family Medicine

## 2013-01-19 ENCOUNTER — Ambulatory Visit (INDEPENDENT_AMBULATORY_CARE_PROVIDER_SITE_OTHER): Payer: BC Managed Care – PPO | Admitting: Family Medicine

## 2013-01-19 ENCOUNTER — Ambulatory Visit (INDEPENDENT_AMBULATORY_CARE_PROVIDER_SITE_OTHER): Payer: BC Managed Care – PPO

## 2013-01-19 VITALS — BP 128/77 | HR 55 | Temp 98.1°F | Ht 69.0 in | Wt 185.8 lb

## 2013-01-19 DIAGNOSIS — M79609 Pain in unspecified limb: Secondary | ICD-10-CM

## 2013-01-19 DIAGNOSIS — F411 Generalized anxiety disorder: Secondary | ICD-10-CM

## 2013-01-19 DIAGNOSIS — M79672 Pain in left foot: Secondary | ICD-10-CM

## 2013-01-19 MED ORDER — MELOXICAM 15 MG PO TABS: 15.0000 mg | ORAL_TABLET | Freq: Every day | ORAL | Status: DC | PRN

## 2013-01-19 NOTE — Patient Instructions (Signed)
Use warm wet compresses to left heel Get a heel cup Take meloxicam daily for one to 2 weeks Protect stomach while taking meloxicam If problems continue get back in touch with my nurse and will set up a visit with an orthopedist to potentially inject the heel Bring back FOBT and remember to check this regularly Check with insurance company regarding the shingles shot

## 2013-01-19 NOTE — Progress Notes (Signed)
  Subjective:    Patient ID: Joseph Oliver, male    DOB: [birth year], 52 y.o.   MRN: [id number]  HPI Patient comes in today complaining of left heel pain for about one month . According to his health maintenance issues he is due a Tdap. He also needs an FOBT and should check with his insurance company to see if they will allow him to get a zostavax.   Review of Systems  Constitutional: Negative.  Negative for activity change.  HENT: Negative.   Eyes: Negative.   Respiratory: Negative.   Cardiovascular: Negative.   Gastrointestinal: Negative.   Endocrine: Negative.   Genitourinary: Negative.   Musculoskeletal: Positive for arthralgias (L heel x 1 mth, NKI).  Neurological: Negative.   Psychiatric/Behavioral: Negative.        Objective:   Physical Exam  Constitutional: He is oriented to person, place, and time. He appears well-developed and well-nourished. No distress.  HENT:  Head: Normocephalic and atraumatic.  Eyes: Conjunctivae are normal. No scleral icterus.  Neck: Normal range of motion.  Musculoskeletal: Normal range of motion. He exhibits tenderness (left plantar heel tender). He exhibits no edema.  Neurological: He is alert and oriented to person, place, and time.  Psychiatric: He has a normal mood and affect. His behavior is normal. Judgment and thought content normal.          Assessment & Plan:  1. Heel pain, left secondary to spur - DG Os Calcis Left  2. History of precancerous colon polyp -Yearly FOBT  3. Check with insurance regarding Zostavax    Patient Instructions  Use warm wet compresses to left heel Get a heel cup Take meloxicam daily for one to 2 weeks Protect stomach while taking meloxicam If problems continue get back in touch with my nurse and will set up a visit with an orthopedist to potentially inject the heel Bring back FOBT and remember to check this regularly Check with insurance company regarding the shingles shot   We will refill  meloxicam and Xanax periodically as needed for [place] flights  Nyra Capes MD

## 2013-01-27 ENCOUNTER — Other Ambulatory Visit (INDEPENDENT_AMBULATORY_CARE_PROVIDER_SITE_OTHER): Payer: BC Managed Care – PPO

## 2013-01-27 DIAGNOSIS — Z1212 Encounter for screening for malignant neoplasm of rectum: Secondary | ICD-10-CM

## 2013-01-28 LAB — SPECIMEN STATUS REPORT

## 2013-01-28 LAB — FECAL OCCULT BLOOD, IMMUNOCHEMICAL

## 2013-02-16 NOTE — Addendum Note (Signed)
Addended by: Lisbeth Ply C on: 02/16/2013 10:45 AM   Modules accepted: Orders

## 2013-02-24 ENCOUNTER — Encounter: Payer: Self-pay | Admitting: *Deleted

## 2013-04-07 ENCOUNTER — Telehealth: Payer: Self-pay | Admitting: Family Medicine

## 2013-04-07 MED ORDER — METHOCARBAMOL 500 MG PO TABS: 500.0000 mg | ORAL_TABLET | Freq: Every day | ORAL | Status: DC | PRN

## 2013-04-07 NOTE — Telephone Encounter (Signed)
This is okay to refill this medication for 3 months 

## 2013-04-07 NOTE — Telephone Encounter (Signed)
Pt notified rx methocarbamol called to [facility]

## 2013-04-29 ENCOUNTER — Telehealth: Payer: Self-pay | Admitting: Family Medicine

## 2013-05-02 ENCOUNTER — Other Ambulatory Visit: Payer: Self-pay | Admitting: *Deleted

## 2013-05-02 NOTE — Telephone Encounter (Signed)
He is okay to refill both of these medications as he is about to run out of them.

## 2013-05-03 NOTE — Telephone Encounter (Signed)
Spoke with patient regarding prescriptions Robaxin needs to be increased to BID per pt Rx now written for only 1 tablet daily Please advise

## 2013-05-03 NOTE — Telephone Encounter (Signed)
RX changed per [facility] and called into [facility] Pt notified

## 2013-05-03 NOTE — Telephone Encounter (Signed)
Take one twice a day instead of one daily as needed on the methocarbamol

## 2013-05-30 MED ORDER — MELOXICAM 15 MG PO TABS: 15.0000 mg | ORAL_TABLET | Freq: Every day | ORAL | Status: DC | PRN

## 2013-07-01 ENCOUNTER — Other Ambulatory Visit: Payer: Self-pay | Admitting: Family Medicine

## 2013-07-18 ENCOUNTER — Telehealth: Payer: Self-pay | Admitting: Family Medicine

## 2013-07-18 ENCOUNTER — Encounter: Payer: Self-pay | Admitting: Family Medicine

## 2013-07-18 ENCOUNTER — Ambulatory Visit (INDEPENDENT_AMBULATORY_CARE_PROVIDER_SITE_OTHER): Payer: BC Managed Care – PPO | Admitting: Family Medicine

## 2013-07-18 VITALS — BP 130/81 | HR 67 | Temp 98.8°F | Ht 69.0 in | Wt 192.0 lb

## 2013-07-18 DIAGNOSIS — Z23 Encounter for immunization: Secondary | ICD-10-CM

## 2013-07-18 DIAGNOSIS — S8010XA Contusion of unspecified lower leg, initial encounter: Secondary | ICD-10-CM

## 2013-07-18 DIAGNOSIS — G4700 Insomnia, unspecified: Secondary | ICD-10-CM

## 2013-07-18 MED ORDER — ALPRAZOLAM 0.5 MG PO TABS: 0.5000 mg | ORAL_TABLET | Freq: Every evening | ORAL | Status: DC | PRN

## 2013-07-18 NOTE — Telephone Encounter (Signed)
Appt given for today 

## 2013-07-18 NOTE — Progress Notes (Signed)
   Subjective:    Patient ID: Joseph Oliver, male    DOB: February 28, 1961, 53 y.o.   MRN: [id number]  HPI This 53 y.o. male presents for evaluation of contusion to right shin and discomfort.  He has A large hematoma that has been draining and now the area surrounding the abrasion and contusion Has discomfort.  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   Review of Systems C/o contusion No chest pain, SOB, HA, dizziness, vision change, N/V, diarrhea, constipation, dysuria, urinary urgency or frequency, myalgias, arthralgias or rash.     Objective:   Physical Exam  Right shin  - Area of ecchymosis on right shin approx 6cm diameter and with central abrasion                     No signs of cellulitis or swelling.       Assessment & Plan:  Need for prophylactic vaccination and inoculation against influenza  Contusion - Apply heating pad and continue meloxicam and reassurance given.  Insomnia - Xanax 05mg  #30 no refill.  Lysbeth Penner FNP

## 2014-05-26 ENCOUNTER — Telehealth: Payer: Self-pay | Admitting: Family Medicine

## 2014-05-26 ENCOUNTER — Other Ambulatory Visit: Payer: Self-pay | Admitting: Family Medicine

## 2014-05-26 MED ORDER — METHOCARBAMOL 500 MG PO TABS: 500.0000 mg | ORAL_TABLET | Freq: Two times a day (BID) | ORAL | Status: DC

## 2014-05-26 MED ORDER — MELOXICAM 15 MG PO TABS: 15.0000 mg | ORAL_TABLET | Freq: Every day | ORAL | Status: DC | PRN

## 2014-05-26 NOTE — Telephone Encounter (Signed)
Pt requesting refill on his meloxicam and robaxin, last seen by bill 07/18/13, no follow up appts scheduled, meloxicam last ordered 05/02/13 1 po qd prn #30 no refills, robaxin last ordered 07/01/13  1po bid #60 with 1 refill. If approved will be sent to pharmacy.

## 2014-05-26 NOTE — Telephone Encounter (Signed)
meds sent to pharm

## 2014-05-29 NOTE — Telephone Encounter (Signed)
Aware ,scripts sent in.

## 2014-06-12 ENCOUNTER — Encounter: Payer: Self-pay | Admitting: Family Medicine

## 2014-06-12 ENCOUNTER — Ambulatory Visit (INDEPENDENT_AMBULATORY_CARE_PROVIDER_SITE_OTHER): Payer: BC Managed Care – PPO | Admitting: Family Medicine

## 2014-06-12 ENCOUNTER — Encounter (INDEPENDENT_AMBULATORY_CARE_PROVIDER_SITE_OTHER): Payer: Self-pay

## 2014-06-12 ENCOUNTER — Ambulatory Visit (INDEPENDENT_AMBULATORY_CARE_PROVIDER_SITE_OTHER): Payer: BC Managed Care – PPO | Admitting: *Deleted

## 2014-06-12 ENCOUNTER — Other Ambulatory Visit: Payer: Self-pay | Admitting: Family Medicine

## 2014-06-12 VITALS — BP 117/73 | HR 72 | Temp 98.6°F | Ht 69.0 in | Wt 192.4 lb

## 2014-06-12 DIAGNOSIS — IMO0002 Reserved for concepts with insufficient information to code with codable children: Secondary | ICD-10-CM

## 2014-06-12 DIAGNOSIS — R229 Localized swelling, mass and lump, unspecified: Principal | ICD-10-CM

## 2014-06-12 DIAGNOSIS — N63 Unspecified lump in unspecified breast: Secondary | ICD-10-CM

## 2014-06-12 DIAGNOSIS — Z23 Encounter for immunization: Secondary | ICD-10-CM

## 2014-06-12 NOTE — Progress Notes (Signed)
   Subjective:    Patient ID: Joseph Oliver, male    DOB: 03-08-1961, 53 y.o.   MRN: [id number]  HPI C/o left breast mass.  Review of Systems  Constitutional: Negative for fever.  HENT: Negative for ear pain.   Eyes: Negative for discharge.  Respiratory: Negative for cough.   Cardiovascular: Negative for chest pain.  Gastrointestinal: Negative for abdominal distention.  Endocrine: Negative for polyuria.  Genitourinary: Negative for difficulty urinating.  Musculoskeletal: Negative for gait problem and neck pain.  Skin: Negative for color change and rash.  Neurological: Negative for speech difficulty and headaches.  Psychiatric/Behavioral: Negative for agitation.       Objective:    BP 117/73 mmHg  Pulse 72  Temp(Src) 98.6 F (37 C) (Oral)  Ht 5\' 9"  (1.753 m)  Wt 192 lb 6.4 oz (87.272 kg)  BMI 28.40 kg/m2 Physical Exam  Left breast - Above areola a 1cm diameter mass is palpated and is nontender      Assessment & Plan:     ICD-9-CM ICD-10-CM   1. Breast mass in male 611.72 N63 MM Digital Diagnostic Unilat L     No Follow-up on file.  Lysbeth Penner FNP

## 2014-06-16 HISTORY — PX: COLONOSCOPY: SHX174

## 2014-06-25 ENCOUNTER — Other Ambulatory Visit: Payer: Self-pay | Admitting: Family Medicine

## 2014-06-26 NOTE — Telephone Encounter (Signed)
Last seen 06/12/14  B [place]  This med not on EPIC med list

## 2014-07-04 ENCOUNTER — Ambulatory Visit
Admission: RE | Admit: 2014-07-04 | Discharge: 2014-07-04 | Disposition: A | Discharge status: Home / Self Care | Payer: BLUE CROSS/BLUE SHIELD | Source: Ambulatory Visit | Attending: Family Medicine | Admitting: Family Medicine

## 2014-07-04 DIAGNOSIS — N63 Unspecified lump in unspecified breast: Secondary | ICD-10-CM

## 2014-07-04 DIAGNOSIS — R229 Localized swelling, mass and lump, unspecified: Principal | ICD-10-CM

## 2014-07-04 DIAGNOSIS — IMO0002 Reserved for concepts with insufficient information to code with codable children: Secondary | ICD-10-CM

## 2014-07-18 ENCOUNTER — Telehealth: Payer: Self-pay | Admitting: Family Medicine

## 2014-07-19 ENCOUNTER — Ambulatory Visit (INDEPENDENT_AMBULATORY_CARE_PROVIDER_SITE_OTHER): Payer: BLUE CROSS/BLUE SHIELD | Admitting: Nurse Practitioner

## 2014-07-19 ENCOUNTER — Encounter: Payer: Self-pay | Admitting: Nurse Practitioner

## 2014-07-19 VITALS — BP 147/84 | HR 75 | Temp 99.7°F | Ht 69.0 in | Wt 189.0 lb

## 2014-07-19 DIAGNOSIS — J0101 Acute recurrent maxillary sinusitis: Secondary | ICD-10-CM

## 2014-07-19 MED ORDER — AZITHROMYCIN 250 MG PO TABS: ORAL_TABLET | ORAL | Status: DC

## 2014-07-19 MED ORDER — HYDROCOD POLST-CHLORPHEN POLST 10-8 MG/5ML PO LQCR: 5.0000 mL | Freq: Two times a day (BID) | ORAL | Status: DC

## 2014-07-19 NOTE — Patient Instructions (Signed)

## 2014-07-19 NOTE — Progress Notes (Signed)
   Subjective:    Patient ID: Joseph Oliver, male    DOB: 11/18/1960, 54 y.o.   MRN: [id number]  HPI Patient is in today c/o cough and sore throat- feels run down. Started Sunday night- has had a low grade fever off and on.  Feels somewhat fatigued.    Review of Systems  Constitutional: Positive for fever and fatigue.  HENT: Positive for congestion and rhinorrhea.   Respiratory: Positive for cough.   Cardiovascular: Negative.   Gastrointestinal: Negative.   Genitourinary: Negative.   Neurological: Negative.   Psychiatric/Behavioral: Negative.   All other systems reviewed and are negative.      Objective:   Physical Exam  Constitutional: He is oriented to person, place, and time. He appears well-developed and well-nourished.  HENT:  Right Ear: Hearing, tympanic membrane, external ear and ear canal normal.  Left Ear: Hearing, tympanic membrane, external ear and ear canal normal.  Nose: Mucosal edema and rhinorrhea present. Right sinus exhibits maxillary sinus tenderness. Right sinus exhibits no frontal sinus tenderness. Left sinus exhibits maxillary sinus tenderness. Left sinus exhibits no frontal sinus tenderness.  Mouth/Throat: Uvula is midline, oropharynx is clear and moist and mucous membranes are normal.  Eyes: Pupils are equal, round, and reactive to light.  Neck: Normal range of motion. Neck supple.  Cardiovascular: Normal rate, regular rhythm and normal heart sounds.   Pulmonary/Chest: Effort normal and breath sounds normal.  Lymphadenopathy:    He has no cervical adenopathy.  Neurological: He is alert and oriented to person, place, and time.  Skin: Skin is warm.  Psychiatric: He has a normal mood and affect. His behavior is normal. Judgment and thought content normal.   BP 147/84 mmHg  Pulse 75  Temp(Src) 99.7 F (37.6 C) (Oral)  Ht 5\' 9"  (1.753 m)  Wt 189 lb (85.73 kg)  BMI 27.90 kg/m2        Assessment & Plan:   1. Acute recurrent maxillary sinusitis      Meds ordered this encounter  Medications  . azithromycin (ZITHROMAX Z-PAK) 250 MG tablet    Sig: As directed    Dispense:  6 each    Refill:  0    Order Specific Question:  Supervising Provider    Answer:  Chipper Herb [[id number]]  . chlorpheniramine-HYDROcodone (TUSSIONEX PENNKINETIC ER) 10-8 MG/5ML LQCR    Sig: Take 5 mLs by mouth 2 (two) times daily.    Dispense:  115 mL    Refill:  0    Order Specific Question:  Supervising Provider    Answer:  Chipper Herb [[id number]]   1. Take meds as prescribed 2. Use a cool mist humidifier especially during the winter months and when heat has been humid. 3. Use saline nose sprays frequently 4. Saline irrigations of the nose can be very helpful if done frequently.  * 4X daily for 1 week*  * Use of a nettie pot can be helpful with this. Follow directions with this* 5. Drink plenty of fluids 6. Keep thermostat turn down low 7.For any cough or congestion  Use plain Mucinex- regular strength or max strength is fine   * Children- consult with Pharmacist for dosing 8. For fever or aces or pains- take tylenol or ibuprofen appropriate for age and weight.  * for fevers greater than 101 orally you may alternate ibuprofen and tylenol every  3 hours.   Mary-Margaret Hassell Done, FNP

## 2014-07-20 NOTE — Telephone Encounter (Signed)
Patient was seen on 2/3 with Joseph Collum, FNP

## 2014-10-23 ENCOUNTER — Encounter: Payer: Self-pay | Admitting: Internal Medicine

## 2014-11-03 ENCOUNTER — Telehealth: Payer: Self-pay | Admitting: Nurse Practitioner

## 2014-11-03 NOTE — Telephone Encounter (Signed)
That will be fine. 

## 2014-11-06 NOTE — Telephone Encounter (Signed)
Stp and he states he could have did this Friday but he has an appt Wednesday so he'll just wait till then.

## 2014-11-07 ENCOUNTER — Encounter: Payer: Self-pay | Admitting: Internal Medicine

## 2014-11-08 ENCOUNTER — Encounter: Payer: Self-pay | Admitting: Family

## 2014-11-08 ENCOUNTER — Ambulatory Visit (INDEPENDENT_AMBULATORY_CARE_PROVIDER_SITE_OTHER): Payer: BLUE CROSS/BLUE SHIELD | Admitting: Family

## 2014-11-08 VITALS — BP 143/91 | HR 91 | Temp 99.7°F | Ht 69.0 in | Wt 187.8 lb

## 2014-11-08 DIAGNOSIS — T148 Other injury of unspecified body region: Secondary | ICD-10-CM | POA: Diagnosis not present

## 2014-11-08 DIAGNOSIS — E559 Vitamin D deficiency, unspecified: Secondary | ICD-10-CM

## 2014-11-08 DIAGNOSIS — W57XXXA Bitten or stung by nonvenomous insect and other nonvenomous arthropods, initial encounter: Secondary | ICD-10-CM

## 2014-11-08 DIAGNOSIS — R5383 Other fatigue: Secondary | ICD-10-CM

## 2014-11-08 DIAGNOSIS — D509 Iron deficiency anemia, unspecified: Secondary | ICD-10-CM

## 2014-11-08 NOTE — Progress Notes (Signed)
   Subjective:    Patient ID: Joseph Oliver, male    DOB: March 30, 1961, 54 y.o.   MRN: [id number]  HPI Pt presents to the office today for "feeling run down". Pt states that this "sluggish" started about a month ago. Pt feeling fatigue and "spacey". Pt states he is also having a soreness in his left leg. Pt state the leg "soreness" started about a month ago. Pt states when he walks he feels a achy and sourness in his left calf. Pt states he is not sure if this could be related to a tick bite. Pt states he pulls off about 3 ticks a week off. Pt denies any headache, palpitations, SOB, rash, fever,  or edema at this time.     Review of Systems  Constitutional: Positive for fatigue.  HENT: Negative.   Respiratory: Negative.   Cardiovascular: Negative.   Gastrointestinal: Negative.   Endocrine: Negative.   Genitourinary: Negative.   Musculoskeletal: Negative.   Neurological: Negative.   Hematological: Negative.   Psychiatric/Behavioral: Negative.   All other systems reviewed and are negative.      Objective:   Physical Exam  Constitutional: He is oriented to person, place, and time. He appears well-developed and well-nourished. No distress.  HENT:  Head: Normocephalic.  Right Ear: External ear normal.  Left Ear: External ear normal.  Nose: Nose normal.  Mouth/Throat: Oropharynx is clear and moist.  Eyes: Pupils are equal, round, and reactive to light. Right eye exhibits no discharge. Left eye exhibits no discharge.  Neck: Normal range of motion. Neck supple. No thyromegaly present.  Cardiovascular: Normal rate, regular rhythm, normal heart sounds and intact distal pulses.   No murmur heard. Pulmonary/Chest: Effort normal and breath sounds normal. No respiratory distress. He has no wheezes.  Abdominal: Soft. Bowel sounds are normal. He exhibits no distension. There is no tenderness.  Musculoskeletal: Normal range of motion. He exhibits no edema or tenderness.  Neurological: He is  alert and oriented to person, place, and time. He has normal reflexes. No cranial nerve deficit.  Skin: Skin is warm and dry. No rash noted. No erythema.  Psychiatric: He has a normal mood and affect. His behavior is normal. Judgment and thought content normal.  Vitals reviewed.     BP 143/91 mmHg  Pulse 91  Temp(Src) 99.7 F (37.6 C) (Oral)  Ht $R'5\' 9"'HL$  (1.753 m)  Wt 187 lb 12.8 oz (85.186 kg)  BMI 27.72 kg/m2     Assessment & Plan:  1. Iron deficiency anemia - Anemia Profile B - CMP14+EGFR  2. Vitamin D deficiency - CMP14+EGFR - Vit D  25 hydroxy (rtn osteoporosis monitoring)  3. Other fatigue - CMP14+EGFR - Thyroid Panel With TSH - Rocky mtn spotted fvr abs pnl(IgG+IgM) - Lyme Ab/Western Blot Reflex  4. Tick bite - [place] mtn spotted fvr abs pnl(IgG+IgM) - Lyme Ab/Western Blot Reflex  Healthy diet discussed Encouraged exercise Labs pending RTO prn  Joseph Dun, FNP

## 2014-11-08 NOTE — Patient Instructions (Addendum)
Vitamin D Deficiency Vitamin D is an important vitamin that your body needs. Having too little of it in your body is called a deficiency. A very bad deficiency can make your bones soft and can cause a condition called rickets.  Vitamin D is important to your body for different reasons, such as:   It helps your body absorb 2 minerals called calcium and phosphorus.  It helps make your bones healthy.  It may prevent some diseases, such as diabetes and multiple sclerosis.  It helps your muscles and heart. You can get vitamin D in several ways. It is a natural part of some foods. The vitamin is also added to some dairy products and cereals. Some people take vitamin D supplements. Also, your body makes vitamin D when you are in the sun. It changes the sun's rays into a form of the vitamin that your body can use. CAUSES   Not eating enough foods that contain vitamin D.  Not getting enough sunlight.  Having certain digestive system diseases that make it hard to absorb vitamin D. These diseases include Crohn's disease, chronic pancreatitis, and cystic fibrosis.  Having a surgery in which part of the stomach or small intestine is removed.  Being obese. Fat cells pull vitamin D out of your blood. That means that obese people may not have enough vitamin D left in their blood and in other body tissues.  Having chronic kidney or liver disease. RISK FACTORS Risk factors are things that make you more likely to develop a vitamin D deficiency. They include:  Being older.  Not being able to get outside very much.  Living in a nursing home.  Having had broken bones.  Having weak or thin bones (osteoporosis).  Having a disease or condition that changes how your body absorbs vitamin D.  Having dark skin.  Some medicines such as seizure medicines or steroids.  Being overweight or obese. SYMPTOMS Mild cases of vitamin D deficiency may not have any symptoms. If you have a very bad case, symptoms  may include:  Bone pain.  Muscle pain.  Falling often.  Broken bones caused by a minor injury, due to osteoporosis. DIAGNOSIS A blood test is the best way to tell if you have a vitamin D deficiency. TREATMENT Vitamin D deficiency can be treated in different ways. Treatment for vitamin D deficiency depends on what is causing it. Options include:  Taking vitamin D supplements.  Taking a calcium supplement. Your caregiver will suggest what dose is best for you. HOME CARE INSTRUCTIONS  Take any supplements that your caregiver prescribes. Follow the directions carefully. Take only the suggested amount.  Have your blood tested 2 months after you start taking supplements.  Eat foods that contain vitamin D. Healthy choices include:  Fortified dairy products, cereals, or juices. Fortified means vitamin D has been added to the food. Check the label on the package to be sure.  Fatty fish like salmon or trout.  Eggs.  Oysters.  Do not use a tanning bed.  Keep your weight at a healthy level. Lose weight if you need to.  Keep all follow-up appointments. Your caregiver will need to perform blood tests to make sure your vitamin D deficiency is going away. SEEK MEDICAL CARE IF:  You have any questions about your treatment.  You continue to have symptoms of vitamin D deficiency.  You have nausea or vomiting.  You are constipated.  You feel confused.  You have severe abdominal or back pain. MAKE   SURE YOU:  Understand these instructions.  Will watch your condition.  Will get help right away if you are not doing well or get worse. Document Released: 08/25/2011 Document Revised: 09/27/2012 Document Reviewed: 08/25/2011 [facility] Patient Information 2015 [place], [place]. This information is not intended to replace advice given to you by your health care provider. Make sure you discuss any questions you have with your health care provider. Fatigue Fatigue is a feeling of  tiredness, lack of energy, lack of motivation, or feeling tired all the time. Having enough rest, good nutrition, and reducing stress will normally reduce fatigue. Consult your caregiver if it persists. The nature of your fatigue will help your caregiver to find out its cause. The treatment is based on the cause.  CAUSES  There are many causes for fatigue. Most of the time, fatigue can be traced to one or more of your habits or routines. Most causes fit into one or more of three general areas. They are: Lifestyle problems  Sleep disturbances.  Overwork.  Physical exertion.  Unhealthy habits.  Poor eating habits or eating disorders.  Alcohol and/or drug use .  Lack of proper nutrition (malnutrition). Psychological problems  Stress and/or anxiety problems.  Depression.  Grief.  Boredom. Medical Problems or Conditions  Anemia.  Pregnancy.  Thyroid gland problems.  Recovery from major surgery.  Continuous pain.  Emphysema or asthma that is not well controlled  Allergic conditions.  Diabetes.  Infections (such as mononucleosis).  Obesity.  Sleep disorders, such as sleep apnea.  Heart failure or other heart-related problems.  Cancer.  Kidney disease.  Liver disease.  Effects of certain medicines such as antihistamines, cough and cold remedies, prescription pain medicines, heart and blood pressure medicines, drugs used for treatment of cancer, and some antidepressants. SYMPTOMS  The symptoms of fatigue include:   Lack of energy.  Lack of drive (motivation).  Drowsiness.  Feeling of indifference to the surroundings. DIAGNOSIS  The details of how you feel help guide your caregiver in finding out what is causing the fatigue. You will be asked about your present and past health condition. It is important to review all medicines that you take, including prescription and non-prescription items. A thorough exam will be done. You will be questioned about your  feelings, habits, and normal lifestyle. Your caregiver may suggest blood tests, urine tests, or other tests to look for common medical causes of fatigue.  TREATMENT  Fatigue is treated by correcting the underlying cause. For example, if you have continuous pain or depression, treating these causes will improve how you feel. Similarly, adjusting the dose of certain medicines will help in reducing fatigue.  HOME CARE INSTRUCTIONS   Try to get the required amount of good sleep every night.  Eat a healthy and nutritious diet, and drink enough water throughout the day.  Practice ways of relaxing (including yoga or meditation).  Exercise regularly.  Make plans to change situations that cause stress. Act on those plans so that stresses decrease over time. Keep your work and personal routine reasonable.  Avoid street drugs and minimize use of alcohol.  Start taking a daily multivitamin after consulting your caregiver. SEEK MEDICAL CARE IF:   You have persistent tiredness, which cannot be accounted for.  You have fever.  You have unintentional weight loss.  You have headaches.  You have disturbed sleep throughout the night.  You are feeling sad.  You have constipation.  You have dry skin.  You have gained weight.  You  are taking any new or different medicines that you suspect are causing fatigue.  You are unable to sleep at night.  You develop any unusual swelling of your legs or other parts of your body. SEEK IMMEDIATE MEDICAL CARE IF:   You are feeling confused.  Your vision is blurred.  You feel faint or pass out.  You develop severe headache.  You develop severe abdominal, pelvic, or back pain.  You develop chest pain, shortness of breath, or an irregular or fast heartbeat.  You are unable to pass a normal amount of urine.  You develop abnormal bleeding such as bleeding from the rectum or you vomit blood.  You have thoughts about harming yourself or committing  suicide.  You are worried that you might harm someone else. MAKE SURE YOU:   Understand these instructions.  Will watch your condition.  Will get help right away if you are not doing well or get worse. Document Released: 03/30/2007 Document Revised: 08/25/2011 Document Reviewed: 10/04/2013 [facility] Patient Information 2015 [place], [place]. This information is not intended to replace advice given to you by your health care provider. Make sure you discuss any questions you have with your health care provider. Iron Deficiency Anemia Anemia is a condition in which there are less red blood cells or hemoglobin in the blood than normal. Hemoglobin is the part of red blood cells that carries oxygen. Iron deficiency anemia is anemia caused by too little iron. It is the most common type of anemia. It may leave you tired and short of breath. CAUSES   Lack of iron in the diet.  Poor absorption of iron, as seen with intestinal disorders.  Intestinal bleeding.  Heavy periods. SIGNS AND SYMPTOMS  Mild anemia may not be noticeable. Symptoms may include:  Fatigue.  Headache.  Pale skin.  Weakness.  Tiredness.  Shortness of breath.  Dizziness.  Cold hands and feet.  Fast or irregular heartbeat. DIAGNOSIS  Diagnosis requires a thorough evaluation and physical exam by your health care provider. Blood tests are generally used to confirm iron deficiency anemia. Additional tests may be done to find the underlying cause of your anemia. These may include:  Testing for blood in the stool (fecal occult blood test).  A procedure to see inside the colon and rectum (colonoscopy).  A procedure to see inside the esophagus and stomach (endoscopy). TREATMENT  Iron deficiency anemia is treated by correcting the cause of the deficiency. Treatment may involve:  Adding iron-rich foods to your diet.  Taking iron supplements. Pregnant or breastfeeding women need to take extra iron because their  normal diet usually does not provide the required amount.  Taking vitamins. Vitamin C improves the absorption of iron. Your health care provider may recommend that you take your iron tablets with a glass of orange juice or vitamin C supplement.  Medicines to make heavy menstrual flow lighter.  Surgery. HOME CARE INSTRUCTIONS   Take iron as directed by your health care provider.  If you cannot tolerate taking iron supplements by mouth, talk to your health care provider about taking them through a vein (intravenously) or an injection into a muscle.  For the best iron absorption, iron supplements should be taken on an empty stomach. If you cannot tolerate them on an empty stomach, you may need to take them with food.  Do not drink milk or take antacids at the same time as your iron supplements. Milk and antacids may interfere with the absorption of iron.  Iron supplements  can cause constipation. Make sure to include fiber in your diet to prevent constipation. A stool softener may also be recommended.  Take vitamins as directed by your health care provider.  Eat a diet rich in iron. Foods high in iron include liver, lean beef, whole-grain bread, eggs, dried fruit, and dark green leafy vegetables. SEEK IMMEDIATE MEDICAL CARE IF:   You faint. If this happens, do not drive. Call your local emergency services (911 in [place] if no other help is available.  You have chest pain.  You feel nauseous or vomit.  You have severe or increased shortness of breath with activity.  You feel weak.  You have a rapid heartbeat.  You have unexplained sweating.  You become light-headed when getting up from a chair or bed. MAKE SURE YOU:   Understand these instructions.  Will watch your condition.  Will get help right away if you are not doing well or get worse. Document Released: 05/30/2000 Document Revised: 06/07/2013 Document Reviewed: 02/07/2013 [facility] Patient Information 2015 [place],  [place]. This information is not intended to replace advice given to you by your health care provider. Make sure you discuss any questions you have with your health care provider.

## 2014-11-10 LAB — CMP14+EGFR
A/G RATIO: 1.8 (ref 1.1–2.5)
ALK PHOS: 69 IU/L (ref 39–117)
ALT: 18 IU/L (ref 0–44)
AST: 18 IU/L (ref 0–40)
Albumin: 4.5 g/dL (ref 3.5–5.5)
BUN / CREAT RATIO: 15 (ref 9–20)
BUN: 14 mg/dL (ref 6–24)
Bilirubin Total: 0.3 mg/dL (ref 0.0–1.2)
CHLORIDE: 99 mmol/L (ref 97–108)
CO2: 25 mmol/L (ref 18–29)
Calcium: 9.2 mg/dL (ref 8.7–10.2)
Creatinine, Ser: 0.93 mg/dL (ref 0.76–1.27)
GFR, EST AFRICAN AMERICAN: 107 mL/min/{1.73_m2} (ref >59)
GFR, EST NON AFRICAN AMERICAN: 93 mL/min/{1.73_m2} (ref >59)
GLOBULIN, TOTAL: 2.5 g/dL (ref 1.5–4.5)
Glucose: 93 mg/dL (ref 65–99)
POTASSIUM: 4.4 mmol/L (ref 3.5–5.2)
SODIUM: 140 mmol/L (ref 134–144)
Total Protein: 7 g/dL (ref 6.0–8.5)

## 2014-11-10 LAB — ANEMIA PROFILE B
BASOS: 0 %
Basophils Absolute: 0 10*3/uL (ref 0.0–0.2)
EOS (ABSOLUTE): 0.1 10*3/uL (ref 0.0–0.4)
Eos: 1 %
FOLATE: 16.6 ng/mL (ref >3.0)
Ferritin: 230 ng/mL (ref 30–400)
Hematocrit: 43.2 % (ref 37.5–51.0)
Hemoglobin: 14.3 g/dL (ref 12.6–17.7)
Immature Grans (Abs): 0 10*3/uL (ref 0.0–0.1)
Immature Granulocytes: 0 %
Iron Saturation: 20 % (ref 15–55)
Iron: 50 ug/dL (ref 38–169)
LYMPHS ABS: 3.4 10*3/uL — AB (ref 0.7–3.1)
LYMPHS: 37 %
MCH: 29.4 pg (ref 26.6–33.0)
MCHC: 33.1 g/dL (ref 31.5–35.7)
MCV: 89 fL (ref 79–97)
MONOS ABS: 0.9 10*3/uL (ref 0.1–0.9)
Monocytes: 9 %
NEUTROS ABS: 4.8 10*3/uL (ref 1.4–7.0)
Neutrophils: 53 %
Platelets: 290 10*3/uL (ref 150–379)
RBC: 4.87 x10E6/uL (ref 4.14–5.80)
RDW: 13.6 % (ref 12.3–15.4)
Retic Ct Pct: 0.8 % (ref 0.6–2.6)
Total Iron Binding Capacity: 256 ug/dL (ref 250–450)
UIBC: 206 ug/dL (ref 111–343)
Vitamin B-12: 483 pg/mL (ref 211–946)
WBC: 9.3 10*3/uL (ref 3.4–10.8)

## 2014-11-10 LAB — THYROID PANEL WITH TSH
FREE THYROXINE INDEX: 2.1 (ref 1.2–4.9)
T3 Uptake Ratio: 30 % (ref 24–39)
T4 TOTAL: 7.1 ug/dL (ref 4.5–12.0)
TSH: 1.29 u[IU]/mL (ref 0.450–4.500)

## 2014-11-10 LAB — LYME AB/WESTERN BLOT REFLEX
LYME DISEASE AB, QUANT, IGM: <0.8 index (ref 0.00–0.79)
Lyme IgG/IgM Ab: <0.91 {ISR} (ref 0.00–0.90)

## 2014-11-10 LAB — RMSF, IGG, IFA: RMSF, IGG, IFA: 1:256 {titer} — ABNORMAL HIGH

## 2014-11-10 LAB — ROCKY MTN SPOTTED FVR ABS PNL(IGG+IGM)
RMSF IGG: POSITIVE — AB
RMSF IgM: 0.59 index (ref 0.00–0.89)

## 2014-11-10 LAB — VITAMIN D 25 HYDROXY (VIT D DEFICIENCY, FRACTURES): Vit D, 25-Hydroxy: 45.1 ng/mL (ref 30.0–100.0)

## 2014-11-14 ENCOUNTER — Telehealth: Payer: Self-pay | Admitting: Family Medicine

## 2014-11-14 MED ORDER — DOXYCYCLINE HYCLATE 100 MG PO TABS: 100.0000 mg | ORAL_TABLET | Freq: Two times a day (BID) | ORAL | Status: DC

## 2014-11-14 NOTE — Telephone Encounter (Signed)
Patient aware to start doxycycline bid with food and to follow up after he finishes the antibiotic.

## 2014-11-14 NOTE — Telephone Encounter (Signed)
Start doxycycline 100 mg twice daily with food for 2 weeks

## 2014-11-14 NOTE — Telephone Encounter (Signed)
Patient received call from health department stating that he was positive for RMSF and patient aware that I will have a provider review since Alyse Low is off today. Patient aware that we will forward labs to someone to review. Please see labs from 11/08/2014.

## 2014-11-15 ENCOUNTER — Telehealth: Payer: Self-pay | Admitting: *Deleted

## 2014-11-15 NOTE — Telephone Encounter (Signed)
Patient upset that he was made aware of his positive [facility] Spotted Fever test through the health department.  He has been stressed that he had not been contacted by this office.( Our office was closed for the holiday and provider  had not received results as soon as the health department.)  Spoke with patient at length about all results and answered all questions as best as possible.  He had already started the script that was sent in on 11-14-2014 per another provider. He would still like to have an in depth conversation with Ms. Hawks to discuss his care during the period of this infection.

## 2014-11-15 NOTE — Telephone Encounter (Signed)
Discussed in length with patient about [facility] Spotted Fever. Pt has rx of doxycyline 100 mg BID for 14 days. Pt told to finish antibiotic and follow up with me in two weeks.

## 2014-11-20 ENCOUNTER — Ambulatory Visit (INDEPENDENT_AMBULATORY_CARE_PROVIDER_SITE_OTHER): Payer: BLUE CROSS/BLUE SHIELD | Admitting: Family Medicine

## 2014-11-20 ENCOUNTER — Encounter: Payer: Self-pay | Admitting: Family Medicine

## 2014-11-20 ENCOUNTER — Telehealth: Payer: Self-pay | Admitting: Family

## 2014-11-20 VITALS — BP 133/78 | HR 60 | Temp 98.8°F | Ht 69.0 in | Wt 191.0 lb

## 2014-11-20 DIAGNOSIS — R202 Paresthesia of skin: Secondary | ICD-10-CM

## 2014-11-20 NOTE — Progress Notes (Signed)
Subjective:  Patient ID: Joseph Oliver, male    DOB: [birth year]  Age: 54 y.o. MRN: [id number]  CC: tingling sensation   HPI Joseph Oliver presents for 3-4 days of mild to moderate tingling in the hands. He was started on doxycycline several days ago for concern of [facility] spotted fever. Patient has been bitten by several ticks recently. They are the size of dog ticks in the past. There has been no edema. No fever chills sweats. No rash.  History Joseph Oliver has a past medical history of Anemia; GERD (gastroesophageal reflux disease); and Hyperlipemia.   He has past surgical history that includes Rhinoplasty; Spinal fusion; Neck surgery; and Hand surgery.   His family history includes Alzheimer's disease in his mother; Cancer in his mother; Drug abuse in his mother. There is no history of Colon cancer, Rectal cancer, or Stomach cancer.He reports that he has never smoked. He has never used smokeless tobacco. He reports that he drinks about 0.6 oz of alcohol per week. He reports that he does not use illicit drugs.  Outpatient Prescriptions Prior to Visit  Medication Sig Dispense Refill  . calcium-vitamin D (OSCAL 500/200 D-3) 500-200 MG-UNIT per tablet Take 1 tablet by mouth daily.    Marland Kitchen doxycycline (VIBRA-TABS) 100 MG tablet Take 1 tablet (100 mg total) by mouth 2 (two) times daily. 28 tablet 0  . Ferrous Sulfate (IRON) 325 (65 FE) MG TABS Take 1 tablet by mouth daily.    . Multiple Vitamins-Minerals (CENTRUM SILVER PO) Take 1 capsule by mouth daily.    . ranitidine (ZANTAC) 75 MG tablet Take 75 mg by mouth 2 (two) times daily.    Marland Kitchen VITAMIN D, CHOLECALCIFEROL, PO Take by mouth as directed.    . meloxicam (MOBIC) 15 MG tablet TAKE 1 TABLET (15 MG TOTAL) BY MOUTH DAILY AS NEEDED. (Patient not taking: Reported on 11/20/2014) 30 tablet 2  . methocarbamol (ROBAXIN) 500 MG tablet Take 1 tablet (500 mg total) by mouth 2 (two) times daily. (Patient not taking: Reported on 11/20/2014) 60 tablet  5   No facility-administered medications prior to visit.    ROS Review of Systems  Constitutional: Negative for fever, chills and diaphoresis.  HENT: Negative for congestion, rhinorrhea and sore throat.   Respiratory: Negative for cough, shortness of breath and wheezing.   Cardiovascular: Negative for chest pain.  Gastrointestinal: Negative for nausea, vomiting, abdominal pain, diarrhea, constipation and abdominal distention.  Genitourinary: Negative for dysuria and frequency.  Musculoskeletal: Negative for joint swelling and arthralgias.  Skin: Negative for rash.  Neurological: Negative for headaches.    Objective:  BP 133/78 mmHg  Pulse 60  Temp(Src) 98.8 F (37.1 C) (Oral)  Ht 5\' 9"  (1.753 m)  Wt 191 lb (86.637 kg)  BMI 28.19 kg/m2  BP Readings from Last 3 Encounters:  11/20/14 133/78  11/08/14 143/91  07/19/14 147/84    Wt Readings from Last 3 Encounters:  11/20/14 191 lb (86.637 kg)  11/08/14 187 lb 12.8 oz (85.186 kg)  07/19/14 189 lb (85.73 kg)     Physical Exam  Constitutional: He is oriented to person, place, and time. He appears well-developed and well-nourished. No distress.  HENT:  Head: Normocephalic and atraumatic.  Eyes: Conjunctivae and EOM are normal. Pupils are equal, round, and reactive to light.  Cardiovascular: Normal rate, regular rhythm and normal heart sounds.   No murmur heard. Pulmonary/Chest: Effort normal and breath sounds normal. No respiratory distress. He has no wheezes. He has no  rales.  Abdominal: Soft. Bowel sounds are normal.  Musculoskeletal: He exhibits no edema or tenderness.  Lymphadenopathy:    He has no cervical adenopathy.  Neurological: He is alert and oriented to person, place, and time. He has normal reflexes. No cranial nerve deficit. Coordination normal.  Skin: Skin is warm and dry. No rash noted.    No results found for: HGBA1C  Lab Results  Component Value Date   WBC 9.3 11/08/2014   HGB 15.3 11/25/2012    HCT 43.2 11/08/2014   GLUCOSE 93 11/08/2014   CHOL 209* 11/25/2012   TRIG 64 11/25/2012   HDL 59 11/25/2012   LDLCALC 137* 11/25/2012   ALT 18 11/08/2014   AST 18 11/08/2014   NA 140 11/08/2014   K 4.4 11/08/2014   CL 99 11/08/2014   CREATININE 0.93 11/08/2014   BUN 14 11/08/2014   CO2 25 11/08/2014   TSH 1.290 11/08/2014    Mm Digital Diagnostic Bilat  07/04/2014   CLINICAL DATA:  Mildly tender mass felt by the patient in the upper outer left periareolar region for the past month.  EXAM: DIGITAL DIAGNOSTIC  BILATERAL MAMMOGRAM WITH CAD  COMPARISON:  None.  ACR Breast Density Category b: There are scattered areas of fibroglandular density.  FINDINGS: A mild amount of retroglandular tissue is demonstrated both breasts, mildly greater on the left. No mass or other findings suspicious for malignancy.  On physical examination, the patient has palpable soft tissue thickening in the upper outer left periareolar region without a discrete palpable mass.  Mammographic images were processed with CAD.  IMPRESSION: Mild bilateral gynecomastia, slightly greater on the left. No evidence of malignancy.  RECOMMENDATION: Clinical followup.  I have discussed the findings and recommendations with the patient. Results were also provided in writing at the conclusion of the visit. If applicable, a reminder letter will be sent to the patient regarding the next appointment.  BI-RADS CATEGORY  2: Benign.   Electronically Signed   By: Enrique Sack M.D.   On: 07/04/2014 09:23    Assessment & Plan:   There are no diagnoses linked to this encounter. I am having Joseph Oliver maintain his Multiple Vitamins-Minerals (CENTRUM SILVER PO), calcium-vitamin D, (VITAMIN D, CHOLECALCIFEROL, PO), Iron, ranitidine, methocarbamol, meloxicam, and doxycycline.  No orders of the defined types were placed in this encounter.     Follow-up: Return if symptoms worsen or fail to improve.  Claretta Fraise, M.D.

## 2014-11-20 NOTE — Telephone Encounter (Signed)
Spoke With Triage

## 2014-12-08 ENCOUNTER — Encounter: Payer: Self-pay | Admitting: Family Medicine

## 2014-12-08 ENCOUNTER — Ambulatory Visit (INDEPENDENT_AMBULATORY_CARE_PROVIDER_SITE_OTHER): Payer: BLUE CROSS/BLUE SHIELD | Admitting: Family Medicine

## 2014-12-08 VITALS — BP 123/77 | HR 77 | Temp 99.2°F | Ht 69.0 in | Wt 185.2 lb

## 2014-12-08 DIAGNOSIS — I70219 Atherosclerosis of native arteries of extremities with intermittent claudication, unspecified extremity: Secondary | ICD-10-CM

## 2014-12-08 NOTE — Progress Notes (Signed)
Subjective:  Patient ID: Joseph Oliver, male    DOB: 1961/01/07  Age: 54 y.o. MRN: [id number]  CC: Leg Pain   HPI Joseph Oliver presents for left leg pain. Spreads from the foot up the leg then thighonly with significant activity on the leg for seveal hours.  History Joseph Oliver has a past medical history of Anemia; GERD (gastroesophageal reflux disease); and Hyperlipemia.   He has past surgical history that includes Rhinoplasty; Spinal fusion; Neck surgery; and Hand surgery.   His family history includes Alzheimer's disease in his mother; Cancer in his mother; Drug abuse in his mother. There is no history of Colon cancer, Rectal cancer, or Stomach cancer.He reports that he has never smoked. He has never used smokeless tobacco. He reports that he drinks about 0.6 oz of alcohol per week. He reports that he does not use illicit drugs.  Outpatient Prescriptions Prior to Visit  Medication Sig Dispense Refill  . calcium-vitamin D (OSCAL 500/200 D-3) 500-200 MG-UNIT per tablet Take 1 tablet by mouth daily.    . Ferrous Sulfate (IRON) 325 (65 FE) MG TABS Take 1 tablet by mouth daily.    . methocarbamol (ROBAXIN) 500 MG tablet Take 1 tablet (500 mg total) by mouth 2 (two) times daily. 60 tablet 5  . Multiple Vitamins-Minerals (CENTRUM SILVER PO) Take 1 capsule by mouth daily.    . ranitidine (ZANTAC) 75 MG tablet Take 75 mg by mouth 2 (two) times daily.    Marland Kitchen VITAMIN D, CHOLECALCIFEROL, PO Take by mouth as directed.    . meloxicam (MOBIC) 15 MG tablet TAKE 1 TABLET (15 MG TOTAL) BY MOUTH DAILY AS NEEDED. (Patient not taking: Reported on 12/08/2014) 30 tablet 2  . doxycycline (VIBRA-TABS) 100 MG tablet Take 1 tablet (100 mg total) by mouth 2 (two) times daily. 28 tablet 0   No facility-administered medications prior to visit.    ROS Review of Systems  Constitutional: Negative for fever, chills and diaphoresis.  HENT: Negative for congestion, rhinorrhea and sore throat.   Respiratory:  Negative for cough, shortness of breath and wheezing.   Cardiovascular: Negative for chest pain.  Gastrointestinal: Negative for nausea, vomiting, abdominal pain, diarrhea, constipation and abdominal distention.  Genitourinary: Negative for dysuria and frequency.  Musculoskeletal: Positive for myalgias and arthralgias. Negative for joint swelling.  Skin: Negative for rash.  Neurological: Negative for headaches.    Objective:  BP 123/77 mmHg  Pulse 77  Temp(Src) 99.2 F (37.3 C) (Oral)  Ht 5\' 9"  (1.753 m)  Wt 185 lb 3.2 oz (84.006 kg)  BMI 27.34 kg/m2  BP Readings from Last 3 Encounters:  12/08/14 123/77  11/20/14 133/78  11/08/14 143/91    Wt Readings from Last 3 Encounters:  12/08/14 185 lb 3.2 oz (84.006 kg)  11/20/14 191 lb (86.637 kg)  11/08/14 187 lb 12.8 oz (85.186 kg)     Physical Exam  Constitutional: He is oriented to person, place, and time. He appears well-developed and well-nourished. No distress.  HENT:  Head: Normocephalic and atraumatic.  Right Ear: External ear normal.  Left Ear: External ear normal.  Nose: Nose normal.  Mouth/Throat: Oropharynx is clear and moist.  Eyes: Conjunctivae and EOM are normal. Pupils are equal, round, and reactive to light.  Neck: Normal range of motion. Neck supple. No thyromegaly present.  Cardiovascular: Normal rate, regular rhythm and normal heart sounds.   No murmur heard. Pulmonary/Chest: Effort normal and breath sounds normal. No respiratory distress. He has no wheezes. He  has no rales.  Abdominal: Soft. Bowel sounds are normal. He exhibits no distension. There is no tenderness.  Musculoskeletal: He exhibits edema and tenderness (:Both lower extremities).  Lymphadenopathy:    He has no cervical adenopathy.  Neurological: He is alert and oriented to person, place, and time. He has normal reflexes.  Skin: Skin is warm and dry.  Psychiatric: He has a normal mood and affect. His behavior is normal. Judgment and thought  content normal.    No results found for: HGBA1C  Lab Results  Component Value Date   WBC 9.3 11/08/2014   HGB 15.3 11/25/2012   HCT 43.2 11/08/2014   GLUCOSE 93 11/08/2014   CHOL 209* 11/25/2012   TRIG 64 11/25/2012   HDL 59 11/25/2012   LDLCALC 137* 11/25/2012   ALT 18 11/08/2014   AST 18 11/08/2014   NA 140 11/08/2014   K 4.4 11/08/2014   CL 99 11/08/2014   CREATININE 0.93 11/08/2014   BUN 14 11/08/2014   CO2 25 11/08/2014   TSH 1.290 11/08/2014    Mm Digital Diagnostic Bilat  07/04/2014   CLINICAL DATA:  Mildly tender mass felt by the patient in the upper outer left periareolar region for the past month.  EXAM: DIGITAL DIAGNOSTIC  BILATERAL MAMMOGRAM WITH CAD  COMPARISON:  None.  ACR Breast Density Category b: There are scattered areas of fibroglandular density.  FINDINGS: A mild amount of retroglandular tissue is demonstrated both breasts, mildly greater on the left. No mass or other findings suspicious for malignancy.  On physical examination, the patient has palpable soft tissue thickening in the upper outer left periareolar region without a discrete palpable mass.  Mammographic images were processed with CAD.  IMPRESSION: Mild bilateral gynecomastia, slightly greater on the left. No evidence of malignancy.  RECOMMENDATION: Clinical followup.  I have discussed the findings and recommendations with the patient. Results were also provided in writing at the conclusion of the visit. If applicable, a reminder letter will be sent to the patient regarding the next appointment.  BI-RADS CATEGORY  2: Benign.   Electronically Signed   By: Joseph Oliver M.D.   On: 07/04/2014 09:23    Assessment & Plan:   Joseph Oliver was seen today for leg pain.  Diagnoses and all orders for this visit:  Atherosclerosis of native arteries of extremity with intermittent claudication Orders: -     Cancel: LE ART SEG MULTI (Segm & LE Reynauds); Future -     Cancel: ABI WITH/WO TBI; Future   I have  discontinued Joseph Oliver doxycycline. I am also having him maintain his Multiple Vitamins-Minerals (CENTRUM SILVER PO), calcium-vitamin D, (VITAMIN D, CHOLECALCIFEROL, PO), Iron, ranitidine, methocarbamol, and meloxicam.  No orders of the defined types were placed in this encounter.     Follow-up: Return in about 2 weeks (around 12/22/2014).  Claretta Fraise, M.D.

## 2014-12-11 ENCOUNTER — Other Ambulatory Visit: Payer: Self-pay | Admitting: Family Medicine

## 2014-12-11 ENCOUNTER — Ambulatory Visit (HOSPITAL_COMMUNITY)
Admission: RE | Admit: 2014-12-11 | Discharge: 2014-12-11 | Disposition: A | Discharge status: Home / Self Care | Payer: BLUE CROSS/BLUE SHIELD | Source: Ambulatory Visit | Attending: Family Medicine | Admitting: Family Medicine

## 2014-12-11 DIAGNOSIS — I70219 Atherosclerosis of native arteries of extremities with intermittent claudication, unspecified extremity: Secondary | ICD-10-CM | POA: Diagnosis not present

## 2014-12-11 NOTE — Progress Notes (Signed)
*  PRELIMINARY RESULTS* Vascular Ultrasound Lower Extremity Arterial Duplex has been completed.  Preliminary findings: Left = no evidence of hemodynamically significant arterial disease.   Landry Mellow, RDMS, RVT  12/11/2014, 3:36 PM

## 2014-12-13 ENCOUNTER — Other Ambulatory Visit: Payer: Self-pay | Admitting: Family

## 2014-12-13 ENCOUNTER — Other Ambulatory Visit: Payer: BLUE CROSS/BLUE SHIELD

## 2014-12-13 DIAGNOSIS — A770 Spotted fever due to Rickettsia rickettsii: Secondary | ICD-10-CM

## 2014-12-22 ENCOUNTER — Telehealth: Payer: Self-pay | Admitting: *Deleted

## 2014-12-22 ENCOUNTER — Telehealth: Payer: Self-pay | Admitting: Family Medicine

## 2014-12-22 NOTE — Telephone Encounter (Signed)
Left mess with APPT info for pt - was set up for CPE with [facility]

## 2014-12-22 NOTE — Telephone Encounter (Signed)
DR Livia Snellen,  Pt is calling and states that he he not heard from the arterial test that you ordered. He had this done on 6/27.  Please review.

## 2014-12-22 NOTE — Telephone Encounter (Signed)
There is a report that his marked preliminary stating that things are normal. Since that has been almost 2 weeks is it possible to contact the performing provider to see if a final report will be issued soon. In the meantime it is okay to let the patient know that the preliminary report is normal and we will get back to him as soon as possible with the final. Thanks, WS.

## 2014-12-22 NOTE — Telephone Encounter (Signed)
Pt notified of results Verbalizes understanding 

## 2015-01-01 ENCOUNTER — Ambulatory Visit (AMBULATORY_SURGERY_CENTER): Payer: Self-pay | Admitting: *Deleted

## 2015-01-01 VITALS — Ht 69.0 in | Wt 191.0 lb

## 2015-01-01 DIAGNOSIS — Z86010 Personal history of colonic polyps: Secondary | ICD-10-CM

## 2015-01-01 MED ORDER — NA SULFATE-K SULFATE-MG SULF 17.5-3.13-1.6 GM/177ML PO SOLN: ORAL | Status: DC

## 2015-01-01 NOTE — Progress Notes (Signed)
Patient denies any allergies to eggs or soy. Patient denies any problems with anesthesia/sedation. Patient denies any oxygen use at home and does not take any diet/weight loss medications. EMMI education assisgned to patient on colonoscopy, this was explained and instructions given to patient. 

## 2015-01-10 ENCOUNTER — Ambulatory Visit: Payer: BLUE CROSS/BLUE SHIELD | Admitting: Family Medicine

## 2015-01-11 ENCOUNTER — Ambulatory Visit (INDEPENDENT_AMBULATORY_CARE_PROVIDER_SITE_OTHER): Payer: BLUE CROSS/BLUE SHIELD | Admitting: Family Medicine

## 2015-01-11 ENCOUNTER — Encounter: Payer: Self-pay | Admitting: Family Medicine

## 2015-01-11 ENCOUNTER — Ambulatory Visit (INDEPENDENT_AMBULATORY_CARE_PROVIDER_SITE_OTHER): Payer: BLUE CROSS/BLUE SHIELD

## 2015-01-11 VITALS — BP 131/80 | HR 80 | Temp 98.9°F | Ht 69.0 in | Wt 185.0 lb

## 2015-01-11 DIAGNOSIS — D509 Iron deficiency anemia, unspecified: Secondary | ICD-10-CM | POA: Diagnosis not present

## 2015-01-11 DIAGNOSIS — N400 Enlarged prostate without lower urinary tract symptoms: Secondary | ICD-10-CM | POA: Diagnosis not present

## 2015-01-11 DIAGNOSIS — K219 Gastro-esophageal reflux disease without esophagitis: Secondary | ICD-10-CM | POA: Diagnosis not present

## 2015-01-11 DIAGNOSIS — Z0000 Encounter for general adult medical examination without abnormal findings: Secondary | ICD-10-CM

## 2015-01-11 DIAGNOSIS — E559 Vitamin D deficiency, unspecified: Secondary | ICD-10-CM

## 2015-01-11 LAB — POCT URINALYSIS DIPSTICK
BILIRUBIN UA: NEGATIVE
Glucose, UA: NEGATIVE
KETONES UA: NEGATIVE
Leukocytes, UA: NEGATIVE
NITRITE UA: NEGATIVE
PROTEIN UA: NEGATIVE
RBC UA: NEGATIVE
SPEC GRAV UA: 1.01
Urobilinogen, UA: NEGATIVE
pH, UA: 6

## 2015-01-11 LAB — POCT UA - MICROSCOPIC ONLY
BACTERIA, U MICROSCOPIC: NEGATIVE
CRYSTALS, UR, HPF, POC: NEGATIVE
Casts, Ur, LPF, POC: NEGATIVE
Mucus, UA: NEGATIVE
RBC, urine, microscopic: NEGATIVE
WBC, UR, HPF, POC: NEGATIVE
Yeast, UA: NEGATIVE

## 2015-01-11 MED ORDER — METHOCARBAMOL 500 MG PO TABS: 500.0000 mg | ORAL_TABLET | Freq: Two times a day (BID) | ORAL | Status: DC | PRN

## 2015-01-11 NOTE — Patient Instructions (Addendum)
Continue current medications. Continue good therapeutic lifestyle changes which include good diet and exercise. Fall precautions discussed with patient. If an FOBT was given today- please return it to our front desk. If you are over 55 years old - you may need Prevnar 70 or the adult Pneumonia vaccine.   After your visit with [place] today you will receive a survey in the mail or online from [employer] regarding your care with [place]. Please take a moment to fill this out. Your feedback is very important to [place] as you can help [place] better understand your patient needs as well as improve your experience and satisfaction. WE CARE ABOUT YOU!!!   Continue to drink plenty of fluids and stay as active physically as possible Follow through with getting her colonoscopy and following up on colon polyps Check with your insurance regarding the Prevnar vaccine and the shingles shot We will arrange for you to have a stress test in the office

## 2015-01-11 NOTE — Progress Notes (Signed)
Subjective:    Patient ID: Joseph Oliver, male    DOB: 04/03/1961, 54 y.o.   MRN: [id number]  HPI  Patient is here today for annual wellness exam and follow up of chronic medical problems which includes anemia and GERD.  He is taking medications regularly. The patient is scheduled next week for his repeat colonoscopy to follow-up for previous colon polyps. He continues to have some GERD symptoms but when he is taking his Zantac this is good. He denies chest pain shortness of breath trouble swallowing blood in the stool or black tarry bowel movements. He says his stream is sometimes slow and sometimes normal. He denies any burning pain or frequency. The patient was treated for 2 weeks for a positive [facility] spotted fever test and he is doing fine as far as that is concerned           Patient Active Problem List   Diagnosis Date Noted  . Vitamin D deficiency 11/08/2014  . Generalized anxiety disorder 01/19/2013  . Iron deficiency anemia 11/14/2011  . Hyperlipidemia 11/14/2011  . GERD (gastroesophageal reflux disease) 11/14/2011   Outpatient Encounter Prescriptions as of 01/11/2015  Medication Sig  . calcium-vitamin D (OSCAL 500/200 D-3) 500-200 MG-UNIT per tablet Take 1 tablet by mouth daily.  . Ferrous Sulfate (IRON) 325 (65 FE) MG TABS Take 1 tablet by mouth daily.  . meloxicam (MOBIC) 15 MG tablet TAKE 1 TABLET (15 MG TOTAL) BY MOUTH DAILY AS NEEDED.  . methocarbamol (ROBAXIN) 500 MG tablet Take 1 tablet (500 mg total) by mouth 2 (two) times daily.  . Multiple Vitamins-Minerals (CENTRUM SILVER PO) Take 1 capsule by mouth daily.  . ranitidine (ZANTAC) 75 MG tablet Take 75 mg by mouth 2 (two) times daily.  Marland Kitchen VITAMIN D, CHOLECALCIFEROL, PO Take by mouth as directed.  . [DISCONTINUED] Na Sulfate-K Sulfate-Mg Sulf SOLN Suprep (no substitutions)-TAKE AS DIRECTED. (Patient not taking: Reported on 01/11/2015)   No facility-administered encounter medications on file as of  01/11/2015.     Review of Systems  Constitutional: Negative.   HENT: Negative.   Eyes: Negative.   Respiratory: Negative.   Cardiovascular: Negative.   Gastrointestinal: Negative.   Endocrine: Negative.   Genitourinary: Negative.   Musculoskeletal: Negative.   Skin: Negative.   Allergic/Immunologic: Negative.   Neurological: Negative.   Hematological: Negative.   Psychiatric/Behavioral: Negative.        Objective:   Physical Exam  Constitutional: He is oriented to person, place, and time. He appears well-developed and well-nourished. No distress.  Pleasant and alert  HENT:  Head: Normocephalic and atraumatic.  Right Ear: External ear normal.  Left Ear: External ear normal.  Nose: Nose normal.  Mouth/Throat: Oropharynx is clear and moist. No oropharyngeal exudate.  Eyes: Conjunctivae and EOM are normal. Pupils are equal, round, and reactive to light. Right eye exhibits no discharge. Left eye exhibits no discharge. No scleral icterus.  Neck: Normal range of motion. Neck supple. No thyromegaly present.  No carotid bruits cervical adenopathy or thyromegaly  Cardiovascular: Normal rate, regular rhythm, normal heart sounds and intact distal pulses.   No murmur heard. At 72/m without murmurs  Pulmonary/Chest: Effort normal and breath sounds normal. No respiratory distress. He has no wheezes. He has no rales. He exhibits no tenderness.  Clear anteriorly and posteriorly  Abdominal: Soft. Bowel sounds are normal. He exhibits no mass. There is no tenderness. There is no rebound and no guarding.  Abdomen is nontender to palpation. There were  no inguinal nodes and there is no organ enlargement or masses.  Genitourinary: Rectum normal and penis normal.  The prostate is enlarged but soft and smooth without lumps or masses. The rectum was clear of masses. There were no inguinal nodes and no inguinal hernias palpated. The external genitalia were within normal limits.  Musculoskeletal: Normal  range of motion. He exhibits no edema or tenderness.  Lymphadenopathy:    He has no cervical adenopathy.  Neurological: He is alert and oriented to person, place, and time. He has normal reflexes. No cranial nerve deficit.  Skin: Skin is warm and dry. No rash noted. No erythema. No pallor.  Psychiatric: He has a normal mood and affect. His behavior is normal. Judgment and thought content normal.  Nursing note and vitals reviewed.  BP 131/80 mmHg  Pulse 80  Temp(Src) 98.9 F (37.2 C) (Oral)  Ht $R'5\' 9"'ry$  (1.753 m)  Wt 185 lb (83.915 kg)  BMI 27.31 kg/m2  EKG: Within normal limits  [facility] reading (PRIMARY) by  Dr. Brunilda Payor x-ray--  no active disease                                       Assessment & Plan:  1. Vitamin D deficiency -Continue current treatment pending results of lab work - POCT CBC - Vit D  25 hydroxy (rtn osteoporosis monitoring)  2. Annual physical exam -Get colonoscopy as planned -We will arrange for a routine stress test this fall -Check with insurance regarding the Prevnar vaccine and shingles shot - POCT CBC - BMP8+EGFR - Hepatic function panel - NMR, lipoprofile - POCT urinalysis dipstick - POCT UA - Microscopic Only - PSA - DG Chest 2 View; Future - EKG 12-Lead  3. Iron deficiency anemia -Get colonoscopy is planned to make sure there are no more polyps - POCT CBC  4. Gastroesophageal reflux disease, esophagitis presence not specified -Continue Zantac and avoid NSAIDs caffeine and alcohol - POCT CBC  5. BPH (benign prostatic hyperplasia) -We will continue to monitor this and if symptoms get worse offer treatment.  Meds ordered this encounter  Medications  . methocarbamol (ROBAXIN) 500 MG tablet    Sig: Take 1 tablet (500 mg total) by mouth 2 (two) times daily as needed for muscle spasms.    Dispense:  60 tablet    Refill:  5   Patient Instructions  Continue current medications. Continue good therapeutic lifestyle changes which include  good diet and exercise. Fall precautions discussed with patient. If an FOBT was given today- please return it to our front desk. If you are over 64 years old - you may need Prevnar 79 or the adult Pneumonia vaccine.   After your visit with [place] today you will receive a survey in the mail or online from [employer] regarding your care with [place]. Please take a moment to fill this out. Your feedback is very important to [place] as you can help [place] better understand your patient needs as well as improve your experience and satisfaction. WE CARE ABOUT YOU!!!   Continue to drink plenty of fluids and stay as active physically as possible Follow through with getting her colonoscopy and following up on colon polyps Check with your insurance regarding the Prevnar vaccine and the shingles shot We will arrange for you to have a stress test in the office   Arrie Senate MD

## 2015-01-15 ENCOUNTER — Encounter: Payer: Self-pay | Admitting: Internal Medicine

## 2015-01-15 ENCOUNTER — Ambulatory Visit (AMBULATORY_SURGERY_CENTER): Payer: BLUE CROSS/BLUE SHIELD | Admitting: Internal Medicine

## 2015-01-15 VITALS — BP 120/84 | HR 48 | Temp 97.3°F | Resp 15 | Ht 69.0 in | Wt 191.0 lb

## 2015-01-15 DIAGNOSIS — Z86010 Personal history of colonic polyps: Secondary | ICD-10-CM | POA: Diagnosis not present

## 2015-01-15 DIAGNOSIS — K635 Polyp of colon: Secondary | ICD-10-CM

## 2015-01-15 DIAGNOSIS — D127 Benign neoplasm of rectosigmoid junction: Secondary | ICD-10-CM

## 2015-01-15 DIAGNOSIS — D125 Benign neoplasm of sigmoid colon: Secondary | ICD-10-CM

## 2015-01-15 MED ORDER — SODIUM CHLORIDE 0.9 % IV SOLN: 500.0000 mL | INTRAVENOUS | Status: DC

## 2015-01-15 NOTE — Op Note (Signed)
[place]  [address] [place], [address]   COLONOSCOPY PROCEDURE REPORT  PATIENT: Joseph Oliver, Joseph Oliver  MR#: [id number] BIRTHDATE: [birth year] , 65  yrs. old GENDER: male ENDOSCOPIST: Jerene Bears, MD PROCEDURE DATE:  01/15/2015 PROCEDURE:   Colonoscopy, surveillance and Colonoscopy with cold biopsy polypectomy First Screening Colonoscopy - Avg.  risk and is 50 yrs.  old or older - No.  Prior Negative Screening - Now for repeat screening. N/A  History of Adenoma - Now for follow-up colonoscopy & has been > or = to 3 yrs.  Yes hx of adenoma.  Has been 3 or more years since last colonoscopy.  Polyps removed today? Yes ASA CLASS:   Class II INDICATIONS:Surveillance due to prior colonic neoplasia and PH Colon Adenoma, last colonoscopy 3 yrs ago. MEDICATIONS: Monitored anesthesia care and Propofol 200 mg IV  DESCRIPTION OF PROCEDURE:   After the risks benefits and alternatives of the procedure were thoroughly explained, informed consent was obtained.  The digital rectal exam revealed no rectal mass.   The LB CF-[id number] Loaner [id number]  endoscope was introduced through the anus and advanced to the cecum, which was identified by both the appendix and ileocecal valve. No adverse events experienced.   The quality of the prep was good.  (Suprep was used) The instrument was then slowly withdrawn as the colon was fully examined. Estimated blood loss is zero unless otherwise noted in this procedure report.   COLON FINDINGS: Two sessile polyps ranging from 2 to 45mm in size were found in the rectosigmoid colon.  Polypectomies were performed with cold forceps.  The resection was complete, the polyp tissue was completely retrieved and sent to histology.   The examination was otherwise normal.  Retroflexed views revealed no abnormalities. The time to cecum = 1.4 Withdrawal time = 11.3   The scope was withdrawn and the procedure completed. COMPLICATIONS: There were no immediate  complications.  ENDOSCOPIC IMPRESSION: 1.   Two sessile polyps ranging from 2 to 82mm in size were found in the rectosigmoid colon; polypectomies were performed with cold forceps 2.   The examination was otherwise normal  RECOMMENDATIONS: 1.  Await pathology results 2.  Repeat Colonoscopy in 5 years. 3.  You will receive a letter within 1-2 weeks with the results of your biopsy as well as final recommendations.  Please call my office if you have not received a letter after 3 weeks.  eSigned:  Jerene Bears, MD 01/15/2015 9:28 AM   cc: Redge Gainer, MD and The Patient

## 2015-01-15 NOTE — Patient Instructions (Signed)
YOU HAD AN ENDOSCOPIC PROCEDURE TODAY AT THE [facility]:   Refer to the procedure report that was given to you for any specific questions about what was found during the examination.  If the procedure report does not answer your questions, please call your gastroenterologist to clarify.  If you requested that your care partner not be given the details of your procedure findings, then the procedure report has been included in a sealed envelope for you to review at your convenience later.  YOU SHOULD EXPECT: Some feelings of bloating in the abdomen. Passage of more gas than usual.  Walking can help get rid of the air that was put into your GI tract during the procedure and reduce the bloating. If you had a lower endoscopy (such as a colonoscopy or flexible sigmoidoscopy) you may notice spotting of blood in your stool or on the toilet paper. If you underwent a bowel prep for your procedure, you may not have a normal bowel movement for a few days.  Please Note:  You might notice some irritation and congestion in your nose or some drainage.  This is from the oxygen used during your procedure.  There is no need for concern and it should clear up in a day or so.  SYMPTOMS TO REPORT IMMEDIATELY:   Following lower endoscopy (colonoscopy or flexible sigmoidoscopy):  Excessive amounts of blood in the stool  Significant tenderness or worsening of abdominal pains  Swelling of the abdomen that is new, acute  Fever of 100F or higher   For urgent or emergent issues, a gastroenterologist can be reached at any hour by calling [phone].   DIET: Your first meal following the procedure should be a small meal and then it is ok to progress to your normal diet. Heavy or fried foods are harder to digest and may make you feel nauseous or bloated.  Likewise, meals heavy in dairy and vegetables can increase bloating.  Drink plenty of fluids but you should avoid alcoholic beverages for 24  hours.  ACTIVITY:  You should plan to take it easy for the rest of today and you should NOT DRIVE or use heavy machinery until tomorrow (because of the sedation medicines used during the test).    FOLLOW UP: Our staff will call the number listed on your records the next business day following your procedure to check on you and address any questions or concerns that you may have regarding the information given to you following your procedure. If we do not reach you, we will leave a message.  However, if you are feeling well and you are not experiencing any problems, there is no need to return our call.  We will assume that you have returned to your regular daily activities without incident.  If any biopsies were taken you will be contacted by phone or by letter within the next 1-3 weeks.  Please call us at [phone] if you have not heard about the biopsies in 3 weeks.    SIGNATURES/CONFIDENTIALITY: You and/or your care partner have signed paperwork which will be entered into your electronic medical record.  These signatures attest to the fact that that the information above on your After Visit Summary has been reviewed and is understood.  Full responsibility of the confidentiality of this discharge information lies with you and/or your care-partner.  Read all of the handouts given to you by your recovery room nurse. 

## 2015-01-15 NOTE — Progress Notes (Signed)
Called to room to assist during endoscopic procedure.  Patient ID and intended procedure confirmed with present staff. Received instructions for my participation in the procedure from the performing physician.  

## 2015-01-15 NOTE — Progress Notes (Signed)
Report to PACU, RN, vss, BBS= Clear.  

## 2015-01-16 ENCOUNTER — Telehealth: Payer: Self-pay | Admitting: *Deleted

## 2015-01-16 NOTE — Telephone Encounter (Signed)
Message left

## 2015-01-20 ENCOUNTER — Other Ambulatory Visit: Payer: BLUE CROSS/BLUE SHIELD

## 2015-01-20 DIAGNOSIS — A770 Spotted fever due to Rickettsia rickettsii: Secondary | ICD-10-CM

## 2015-01-23 LAB — ROCKY MTN SPOTTED FVR ABS PNL(IGG+IGM)
RMSF IgG: POSITIVE — AB
RMSF IgM: 0.62 index (ref 0.00–0.89)

## 2015-01-23 LAB — RMSF, IGG, IFA: RMSF, IGG, IFA: 1:256 {titer} — ABNORMAL HIGH

## 2015-01-24 ENCOUNTER — Encounter: Payer: Self-pay | Admitting: Internal Medicine

## 2015-02-06 ENCOUNTER — Telehealth: Payer: Self-pay | Admitting: Family Medicine

## 2015-02-06 NOTE — Addendum Note (Signed)
Addended by: Earlene Plater on: 02/06/2015 04:23 PM   Modules accepted: Miquel Dunn

## 2015-02-06 NOTE — Telephone Encounter (Signed)
Notified pt of [facility] and [facility] results

## 2015-02-17 ENCOUNTER — Other Ambulatory Visit: Payer: BLUE CROSS/BLUE SHIELD

## 2015-02-17 DIAGNOSIS — E785 Hyperlipidemia, unspecified: Secondary | ICD-10-CM

## 2015-02-20 LAB — NMR, LIPOPROFILE
Cholesterol: 206 mg/dL — ABNORMAL HIGH (ref 100–199)
HDL CHOLESTEROL BY NMR: 51 mg/dL (ref >39)
HDL Particle Number: 28.5 umol/L — ABNORMAL LOW (ref >=30.5)
LDL PARTICLE NUMBER: 1460 nmol/L — AB (ref <1000)
LDL Size: 21.2 nm (ref >20.5)
LDL-C: 136 mg/dL — ABNORMAL HIGH (ref 0–99)
LP-IR SCORE: 55 — AB (ref <=45)
Small LDL Particle Number: 593 nmol/L — ABNORMAL HIGH (ref <=527)
Triglycerides by NMR: 96 mg/dL (ref 0–149)

## 2015-02-23 ENCOUNTER — Ambulatory Visit: Payer: BLUE CROSS/BLUE SHIELD | Admitting: Pharmacist

## 2015-05-18 ENCOUNTER — Telehealth: Payer: Self-pay | Admitting: Family Medicine

## 2015-05-24 NOTE — Telephone Encounter (Signed)
I do not see any cardiac risk factors at all, we could anteriorly him in as a cardiovascular screening but we are testing that out with another patient first and making sure that it will build out correctly before we want to commit to doing anymore under those diagnosis. If he is having any chest pain on exertion or shortness of breath on exertion then we can schedule him as that. So we may want to wait on his until we know for sure that the other patients build out correctly and didn't get stuck with the bill. Caryl Pina, MD [facility] 05/24/2015, 10:48 AM

## 2015-07-17 ENCOUNTER — Ambulatory Visit (INDEPENDENT_AMBULATORY_CARE_PROVIDER_SITE_OTHER): Payer: BLUE CROSS/BLUE SHIELD | Admitting: Family Medicine

## 2015-07-17 ENCOUNTER — Encounter: Payer: Self-pay | Admitting: Family Medicine

## 2015-07-17 VITALS — BP 147/77 | HR 60 | Temp 98.6°F | Ht 69.0 in | Wt 188.8 lb

## 2015-07-17 DIAGNOSIS — M542 Cervicalgia: Secondary | ICD-10-CM | POA: Diagnosis not present

## 2015-07-17 MED ORDER — OXYCODONE-ACETAMINOPHEN 5-325 MG PO TABS: 1.0000 | ORAL_TABLET | ORAL | Status: DC | PRN

## 2015-07-17 NOTE — Progress Notes (Signed)
Subjective:  Patient ID: Joseph Oliver, male    DOB: [birth year]  Age: 55 y.o. MRN: [id number]  CC: Back Pain   HPI Joseph Oliver presents for onset of sharp pain in the neck 2 days ago. The night before he had replaced 3 overhead light bulbs that involved neck extension. He also reached overhead. He had been told in the past not to do that due to his three-level cervical spinal fusion. Unfortunately the next day he complained of developing 6-7/10 cervical pain at the left posterior region. He also has decreased range of motion for neck rotation toward the left. Denies numbness tingling and radiation. The pain is sharp at intervals with movement but dull and aching when still. Pain is interfering with sleep. He generally uses methocarbamol and meloxicam with fairly good results. However he has used Percocet and requests a refill of that medicine. He shows me a bottle dated from 2 years ago showing that one bottle has lasted him a long time and that he rarely uses it.   History Joseph Oliver has a past medical history of Anemia; GERD (gastroesophageal reflux disease); Hyperlipemia; and Vitamin D deficiency.   He has past surgical history that includes Rhinoplasty; Spinal fusion; Neck surgery; and Hand surgery.   His family history includes Alzheimer's disease in his mother; Cancer in his mother; Drug abuse in his mother. There is no history of Colon cancer, Rectal cancer, or Stomach cancer.He reports that he has never smoked. He has never used smokeless tobacco. He reports that he drinks alcohol. He reports that he does not use illicit drugs.    ROS Review of Systems  Constitutional: Negative for fever, chills and diaphoresis.  HENT: Negative for rhinorrhea and sore throat.   Respiratory: Negative for cough and shortness of breath.   Cardiovascular: Negative for chest pain.  Gastrointestinal: Negative for abdominal pain.  Musculoskeletal: Negative for myalgias and arthralgias.  Skin:  Negative for rash.  Neurological: Negative for weakness and headaches.    Objective:  BP 147/77 mmHg  Pulse 60  Temp(Src) 98.6 F (37 C) (Oral)  Ht 5\' 9"  (1.753 m)  Wt 188 lb 12.8 oz (85.639 kg)  BMI 27.87 kg/m2  SpO2 100%  BP Readings from Last 3 Encounters:  07/17/15 147/77  01/15/15 120/84  01/11/15 131/80    Wt Readings from Last 3 Encounters:  07/17/15 188 lb 12.8 oz (85.639 kg)  01/15/15 191 lb (86.637 kg)  01/11/15 185 lb (83.915 kg)     Physical Exam  Constitutional: He is oriented to person, place, and time. He appears well-developed and well-nourished. No distress.  HENT:  Head: Normocephalic and atraumatic.  Right Ear: External ear normal.  Left Ear: External ear normal.  Nose: Nose normal.  Mouth/Throat: Oropharynx is clear and moist.  Eyes: Conjunctivae and EOM are normal. Pupils are equal, round, and reactive to light.  Neck: Normal range of motion. Neck supple. No tracheal deviation present. No thyromegaly present.  Cardiovascular: Normal rate, regular rhythm and normal heart sounds.   No murmur heard. Pulmonary/Chest: Effort normal and breath sounds normal. No respiratory distress.  Abdominal: Soft. Bowel sounds are normal.  Musculoskeletal: He exhibits tenderness (;Left posterior neck with tightness in the cervical musculature.).  Lymphadenopathy:    He has no cervical adenopathy.  Neurological: He is alert and oriented to person, place, and time. He has normal reflexes. No cranial nerve deficit.  Skin: Skin is warm and dry.  Psychiatric: He has a normal mood and affect.  His behavior is normal. Judgment and thought content normal.     Lab Results  Component Value Date   WBC 9.3 11/08/2014   HGB 15.3 11/25/2012   HCT 43.2 11/08/2014   PLT 290 11/08/2014   GLUCOSE 93 11/08/2014   CHOL 206* 02/17/2015   TRIG 96 02/17/2015   HDL 51 02/17/2015   LDLCALC 137* 11/25/2012   ALT 18 11/08/2014   AST 18 11/08/2014   NA 140 11/08/2014   K 4.4  11/08/2014   CL 99 11/08/2014   CREATININE 0.93 11/08/2014   BUN 14 11/08/2014   CO2 25 11/08/2014   TSH 1.290 11/08/2014    No results found.  Assessment & Plan:   Joseph Oliver was seen today for back pain.  Diagnoses and all orders for this visit:  Cervicalgia -     Ambulatory referral to Physical Therapy  Other orders -     oxyCODONE-acetaminophen (ROXICET) 5-325 MG tablet; Take 1 tablet by mouth every 4 (four) hours as needed for moderate pain or severe pain.      I am having Joseph Oliver start on oxyCODONE-acetaminophen. I am also having him maintain his Multiple Vitamins-Minerals (CENTRUM SILVER PO), calcium-vitamin D, (VITAMIN D, CHOLECALCIFEROL, PO), Iron, ranitidine, meloxicam, and methocarbamol.  Meds ordered this encounter  Medications  . oxyCODONE-acetaminophen (ROXICET) 5-325 MG tablet    Sig: Take 1 tablet by mouth every 4 (four) hours as needed for moderate pain or severe pain.    Dispense:  30 tablet    Refill:  0     Follow-up: Return in about 2 weeks (around 07/31/2015), or if symptoms worsen or fail to improve.  Joseph Oliver, M.D.

## 2015-07-25 ENCOUNTER — Other Ambulatory Visit: Payer: Self-pay

## 2015-07-25 MED ORDER — MELOXICAM 15 MG PO TABS: ORAL_TABLET | ORAL | Status: DC

## 2015-08-21 ENCOUNTER — Telehealth: Payer: Self-pay | Admitting: Family Medicine

## 2015-08-21 ENCOUNTER — Encounter: Payer: Self-pay | Admitting: Family Medicine

## 2015-08-21 ENCOUNTER — Ambulatory Visit (INDEPENDENT_AMBULATORY_CARE_PROVIDER_SITE_OTHER): Payer: BLUE CROSS/BLUE SHIELD | Admitting: Family Medicine

## 2015-08-21 VITALS — BP 138/84 | HR 62 | Temp 98.9°F | Ht 69.0 in | Wt 190.5 lb

## 2015-08-21 DIAGNOSIS — M542 Cervicalgia: Secondary | ICD-10-CM | POA: Diagnosis not present

## 2015-08-21 MED ORDER — OXYCODONE-ACETAMINOPHEN 5-325 MG PO TABS: 1.0000 | ORAL_TABLET | ORAL | Status: DC | PRN

## 2015-08-21 NOTE — Progress Notes (Signed)
Subjective:  Patient ID: Joseph Oliver, male    DOB: 11/09/1960  Age: 55 y.o. MRN: [id number]  CC: Neck Pain   HPI RAWLIN ANTOLINI presents for states that pain has returned to the left posterior side of his neck. It does not radiate. There is no numbness weakness or tingling of the upper extremities. No radiation into the back. It hurts to bend his neck forward. He is pain-free if he sits still. He continues to take his meloxicam and methocarbamol. He has gone through all but a very few of the bottle of pills given him 6 weeks ago. He states that he took several with the pain syndrome he had that time and didn't have enough left this time. He has contacted his neurosurgeon and made an appointment for March 22 15 days from now. He just needs enough pain pills for him to take 4 times a day until March 22. Requesting a prescription for 60 Percocet. 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 He feels this is not neurologic, however he states he made an appointment with his neurosurgeon just to make sure. It hurts to turn his neck to his left. History Oron has a past medical history of Anemia; GERD (gastroesophageal reflux disease); Hyperlipemia; and Vitamin D deficiency.   He has past surgical history that includes Rhinoplasty; Spinal fusion; Neck surgery; and Hand surgery.   His family history includes Alzheimer's disease in his mother; Cancer in his mother; Drug abuse in his mother. There is no history of Colon cancer, Rectal cancer, or Stomach cancer.He reports that he has never smoked. He has never used smokeless tobacco. He reports that he drinks alcohol. He reports that he does not use illicit drugs.    ROS Review of Systems  Constitutional: Negative for fever, activity change and appetite change.  Cardiovascular: Negative for chest pain.  Gastrointestinal: Negative for abdominal pain.  Musculoskeletal: Positive for myalgias, arthralgias, neck pain and neck stiffness.  Negative for back pain.    Objective:  BP 138/84 mmHg  Pulse 62  Temp(Src) 98.9 F (37.2 C) (Oral)  Ht 5\' 9"  (1.753 m)  Wt 190 lb 8 oz (86.41 kg)  BMI 28.12 kg/m2  BP Readings from Last 3 Encounters:  08/21/15 138/84  07/17/15 147/77  01/15/15 120/84    Wt Readings from Last 3 Encounters:  08/21/15 190 lb 8 oz (86.41 kg)  07/17/15 188 lb 12.8 oz (85.639 kg)  01/15/15 191 lb (86.637 kg)     Physical Exam  Constitutional: He appears well-developed and well-nourished.  HENT:  Head: Normocephalic and atraumatic.  Right Ear: External ear normal.  Left Ear: External ear normal.  Mouth/Throat: No oropharyngeal exudate or posterior oropharyngeal erythema.  Eyes: Pupils are equal, round, and reactive to light.  Neck: Normal range of motion. Neck supple.  Cardiovascular: Normal rate and regular rhythm.   No murmur heard. Pulmonary/Chest: Breath sounds normal. No respiratory distress.  Abdominal: Bowel sounds are normal.  Musculoskeletal: He exhibits tenderness (mild tender spasm in the cervical spinal musculature to the left and at the superior trapezius border near its origin at the nuchal ridge).  Vitals reviewed.    Lab Results  Component Value Date   WBC 9.3 11/08/2014   HGB 15.3 11/25/2012   HCT 43.2 11/08/2014   PLT 290 11/08/2014   GLUCOSE 93 11/08/2014   CHOL 206* 02/17/2015   TRIG 96 02/17/2015   HDL 51 02/17/2015   LDLCALC 137* 11/25/2012   ALT  18 11/08/2014   AST 18 11/08/2014   NA 140 11/08/2014   K 4.4 11/08/2014   CL 99 11/08/2014   CREATININE 0.93 11/08/2014   BUN 14 11/08/2014   CO2 25 11/08/2014   TSH 1.290 11/08/2014    No results found.  Assessment & Plan:   Marcques was seen today for neck pain.  Diagnoses and all orders for this visit:  Cervicalgia  Other orders -     oxyCODONE-acetaminophen (ROXICET) 5-325 MG tablet; Take 1 tablet by mouth every 4 (four) hours as needed for moderate pain or severe pain.   Second recent request  for Schedule 2 drug. Will run [place] report. Self referred to neurosurgery so pain clinic not currently under consideration. If further opiates requested, he will need pain clinic referral.  I am having Mr. Talford maintain his Multiple Vitamins-Minerals (CENTRUM SILVER PO), calcium-vitamin D, (VITAMIN D, CHOLECALCIFEROL, PO), Iron, ranitidine, methocarbamol, meloxicam, and oxyCODONE-acetaminophen.  Meds ordered this encounter  Medications  . oxyCODONE-acetaminophen (ROXICET) 5-325 MG tablet    Sig: Take 1 tablet by mouth every 4 (four) hours as needed for moderate pain or severe pain.    Dispense:  60 tablet    Refill:  0     Follow-up: Return if symptoms worsen or fail to improve.  Claretta Fraise, M.D.

## 2015-08-21 NOTE — Telephone Encounter (Signed)
Patient was seen in January for cervical pain, has an appointment with a specialist later this month.  Is leaving to go out of town on a business trip and will run out of medication while he is gone.  Appointment was made for him to be seen by Dr. Livia Snellen today at 2:10 pm.

## 2015-08-21 NOTE — Telephone Encounter (Signed)
In order to consider this patient request, the patient will need to see their PCP.

## 2015-11-09 ENCOUNTER — Other Ambulatory Visit: Payer: Self-pay | Admitting: Family Medicine

## 2015-11-30 ENCOUNTER — Ambulatory Visit (INDEPENDENT_AMBULATORY_CARE_PROVIDER_SITE_OTHER): Payer: BLUE CROSS/BLUE SHIELD | Admitting: Family Medicine

## 2015-11-30 ENCOUNTER — Encounter: Payer: Self-pay | Admitting: Family Medicine

## 2015-11-30 VITALS — BP 121/68 | HR 78 | Temp 98.4°F | Ht 69.0 in | Wt 188.0 lb

## 2015-11-30 DIAGNOSIS — L509 Urticaria, unspecified: Secondary | ICD-10-CM | POA: Diagnosis not present

## 2015-11-30 MED ORDER — METHYLPREDNISOLONE ACETATE 80 MG/ML IJ SUSP
80.0000 mg | Freq: Once | INTRAMUSCULAR | Status: AC
  Administered 2015-11-30: 80 mg via INTRAMUSCULAR

## 2015-11-30 NOTE — Progress Notes (Signed)
BP 121/68 mmHg  Pulse 78  Temp(Src) 98.4 F (36.9 C) (Oral)  Ht 5\' 9"  (1.753 m)  Wt 188 lb (85.276 kg)  BMI 27.75 kg/m2   Subjective:    Patient ID: Joseph Oliver, male    DOB: 1960-08-31, 55 y.o.   MRN: [id number]  HPI: Joseph Oliver is a 55 y.o. male presenting on 11/30/2015 for Urticaria   HPI Rash and itchiness Patient has developed rash and itchiness that has been spreading over his body since late last night. He has never had hives before but is very much like hives. He has tried some over-the-counter hydrocortisone without much success. Going history has not had any new foods or new medications. He has not been exposed to any new environmental things or soaps or detergents. He has never had this reaction before until now. He denies any tightness in his throat or shortness of breath or wheezing.  Relevant past medical, surgical, family and social history reviewed and updated as indicated. Interim medical history since our last visit reviewed. Allergies and medications reviewed and updated.  Review of Systems  Constitutional: Negative for fever.  HENT: Negative for ear discharge and ear pain.   Eyes: Negative for discharge and visual disturbance.  Respiratory: Negative for shortness of breath and wheezing.   Cardiovascular: Negative for chest pain and leg swelling.  Gastrointestinal: Negative for abdominal pain, diarrhea and constipation.  Genitourinary: Negative for difficulty urinating.  Musculoskeletal: Negative for back pain and gait problem.  Skin: Positive for color change and rash.  Neurological: Negative for syncope, light-headedness and headaches.  All other systems reviewed and are negative.   Per HPI unless specifically indicated above     Medication List       This list is accurate as of: 11/30/15  5:43 PM.  Always use your most recent med list.               CENTRUM SILVER PO  Take 1 capsule by mouth daily.     Iron 325 (65 Fe) MG Tabs    Take 1 tablet by mouth daily.     methocarbamol 500 MG tablet  Commonly known as:  ROBAXIN  TAKE 1 TABLET (500 MG TOTAL) BY MOUTH 2 (TWO) TIMES DAILY AS NEEDED FOR MUSCLE SPASMS.     OSCAL 500/200 D-3 500-200 MG-UNIT tablet  Generic drug:  calcium-vitamin D  Take 1 tablet by mouth daily.     oxyCODONE-acetaminophen 5-325 MG tablet  Commonly known as:  ROXICET  Take 1 tablet by mouth every 4 (four) hours as needed for moderate pain or severe pain.     ranitidine 150 MG tablet  Commonly known as:  ZANTAC  Take 150 mg by mouth 2 (two) times daily.     VITAMIN D (CHOLECALCIFEROL) PO  Take by mouth as directed.           Objective:    BP 121/68 mmHg  Pulse 78  Temp(Src) 98.4 F (36.9 C) (Oral)  Ht 5\' 9"  (1.753 m)  Wt 188 lb (85.276 kg)  BMI 27.75 kg/m2  Wt Readings from Last 3 Encounters:  11/30/15 188 lb (85.276 kg)  08/21/15 190 lb 8 oz (86.41 kg)  07/17/15 188 lb 12.8 oz (85.639 kg)    Physical Exam  Constitutional: He is oriented to person, place, and time. He appears well-developed and well-nourished. No distress.  HENT:  Mouth/Throat: No oropharyngeal exudate.  Eyes: Conjunctivae and EOM are normal. Pupils are equal, round, and  reactive to light. Right eye exhibits no discharge. No scleral icterus.  Neck: Neck supple. No thyromegaly present.  Cardiovascular: Normal rate, regular rhythm, normal heart sounds and intact distal pulses.   No murmur heard. Pulmonary/Chest: Effort normal and breath sounds normal. No stridor. No respiratory distress. He has no wheezes. He has no rales. He exhibits no tenderness.  Musculoskeletal: Normal range of motion. He exhibits no edema.  Lymphadenopathy:    He has no cervical adenopathy.  Neurological: He is alert and oriented to person, place, and time. Coordination normal.  Skin: Skin is warm and dry. Rash noted. Rash is urticarial (Patient has wheels and hives on arms and chest and legs. They are scattered and migrating.). He is  not diaphoretic.  Psychiatric: He has a normal mood and affect. His behavior is normal.  Nursing note and vitals reviewed.     Assessment & Plan:   Problem List Items Addressed This Visit    None    Visit Diagnoses    Urticaria    -  Primary    Diffuse, unknown reaction, no new foods and medications, we will give steroid shot and recommend Benadryl around-the-clock    Relevant Medications    methylPREDNISolone acetate (DEPO-MEDROL) injection 80 mg (Completed)        Follow up plan: Return if symptoms worsen or fail to improve.  Counseling provided for all of the vaccine components No orders of the defined types were placed in this encounter.    Caryl Pina, MD [facility] 11/30/2015, 5:43 PM

## 2015-12-06 ENCOUNTER — Telehealth: Payer: Self-pay | Admitting: Family Medicine

## 2015-12-07 NOTE — Telephone Encounter (Signed)
Spoke to pt and advised of MD feedback and pt isn't satisfied with the recommendations and only wants to speak with Dr.Dettinger.

## 2015-12-07 NOTE — Telephone Encounter (Signed)
Covering for PCP  I recommend transitioning to a daily antihistamine that is nonsedating, consider plain Zyrtec or Allegra daily.  If this does not help try 3 additional days of scheduled Benadryl and consider follow-up early next week. If symptoms are not bothersome than there is no reason to continue medicating.  Laroy Apple, MD [facility] 12/07/2015, 10:22 AM

## 2015-12-21 ENCOUNTER — Other Ambulatory Visit: Payer: Self-pay | Admitting: Family Medicine

## 2015-12-24 NOTE — Telephone Encounter (Signed)
Last seen 08/21/15  Dr Livia Snellen

## 2016-01-12 ENCOUNTER — Other Ambulatory Visit: Payer: Self-pay | Admitting: *Deleted

## 2016-01-12 ENCOUNTER — Other Ambulatory Visit: Payer: BLUE CROSS/BLUE SHIELD

## 2016-01-12 DIAGNOSIS — F411 Generalized anxiety disorder: Secondary | ICD-10-CM

## 2016-01-12 DIAGNOSIS — E559 Vitamin D deficiency, unspecified: Secondary | ICD-10-CM

## 2016-01-12 DIAGNOSIS — K219 Gastro-esophageal reflux disease without esophagitis: Secondary | ICD-10-CM

## 2016-01-12 DIAGNOSIS — Z0000 Encounter for general adult medical examination without abnormal findings: Secondary | ICD-10-CM | POA: Diagnosis not present

## 2016-01-12 DIAGNOSIS — D509 Iron deficiency anemia, unspecified: Secondary | ICD-10-CM

## 2016-01-12 DIAGNOSIS — E785 Hyperlipidemia, unspecified: Secondary | ICD-10-CM

## 2016-01-13 LAB — CBC WITH DIFFERENTIAL/PLATELET
Basophils Absolute: 0 10*3/uL (ref 0.0–0.2)
Basos: 0 %
EOS (ABSOLUTE): 0.2 10*3/uL (ref 0.0–0.4)
EOS: 3 %
HEMATOCRIT: 41.9 % (ref 37.5–51.0)
HEMOGLOBIN: 13.5 g/dL (ref 12.6–17.7)
IMMATURE GRANULOCYTES: 0 %
Immature Grans (Abs): 0 10*3/uL (ref 0.0–0.1)
LYMPHS ABS: 3.4 10*3/uL — AB (ref 0.7–3.1)
Lymphs: 41 %
MCH: 29.5 pg (ref 26.6–33.0)
MCHC: 32.2 g/dL (ref 31.5–35.7)
MCV: 92 fL (ref 79–97)
MONOCYTES: 10 %
Monocytes Absolute: 0.8 10*3/uL (ref 0.1–0.9)
Neutrophils Absolute: 3.8 10*3/uL (ref 1.4–7.0)
Neutrophils: 46 %
Platelets: 295 10*3/uL (ref 150–379)
RBC: 4.58 x10E6/uL (ref 4.14–5.80)
RDW: 14 % (ref 12.3–15.4)
WBC: 8.3 10*3/uL (ref 3.4–10.8)

## 2016-01-14 ENCOUNTER — Encounter: Payer: Self-pay | Admitting: Family Medicine

## 2016-01-14 ENCOUNTER — Ambulatory Visit (INDEPENDENT_AMBULATORY_CARE_PROVIDER_SITE_OTHER): Payer: BLUE CROSS/BLUE SHIELD | Admitting: Family Medicine

## 2016-01-14 VITALS — BP 119/76 | HR 69 | Temp 98.6°F | Ht 69.0 in | Wt 184.0 lb

## 2016-01-14 DIAGNOSIS — D509 Iron deficiency anemia, unspecified: Secondary | ICD-10-CM

## 2016-01-14 DIAGNOSIS — R899 Unspecified abnormal finding in specimens from other organs, systems and tissues: Secondary | ICD-10-CM

## 2016-01-14 DIAGNOSIS — R7989 Other specified abnormal findings of blood chemistry: Secondary | ICD-10-CM

## 2016-01-14 DIAGNOSIS — Z0000 Encounter for general adult medical examination without abnormal findings: Secondary | ICD-10-CM | POA: Diagnosis not present

## 2016-01-14 DIAGNOSIS — N400 Enlarged prostate without lower urinary tract symptoms: Secondary | ICD-10-CM

## 2016-01-14 DIAGNOSIS — K219 Gastro-esophageal reflux disease without esophagitis: Secondary | ICD-10-CM

## 2016-01-14 DIAGNOSIS — E559 Vitamin D deficiency, unspecified: Secondary | ICD-10-CM

## 2016-01-14 DIAGNOSIS — E785 Hyperlipidemia, unspecified: Secondary | ICD-10-CM

## 2016-01-14 LAB — URINALYSIS, COMPLETE
Bilirubin, UA: NEGATIVE
Glucose, UA: NEGATIVE
Ketones, UA: NEGATIVE
Leukocytes, UA: NEGATIVE
Nitrite, UA: NEGATIVE
Protein, UA: NEGATIVE
RBC, UA: NEGATIVE
Specific Gravity, UA: <1.005 — ABNORMAL LOW (ref 1.005–1.030)
Urobilinogen, Ur: 0.2 mg/dL (ref 0.2–1.0)
pH, UA: 6 (ref 5.0–7.5)

## 2016-01-14 LAB — NMR, LIPOPROFILE
CHOLESTEROL: 185 mg/dL (ref 100–199)
HDL CHOLESTEROL BY NMR: 56 mg/dL (ref >39)
HDL Particle Number: 28.3 umol/L — ABNORMAL LOW (ref >=30.5)
LDL PARTICLE NUMBER: 1161 nmol/L — AB (ref <1000)
LDL Size: 22 nm (ref >20.5)
LDL-C: 109 mg/dL — AB (ref 0–99)
LP-IR Score: 35 (ref <=45)
Small LDL Particle Number: 184 nmol/L (ref <=527)
TRIGLYCERIDES BY NMR: 101 mg/dL (ref 0–149)

## 2016-01-14 LAB — VITAMIN D 25 HYDROXY (VIT D DEFICIENCY, FRACTURES): VIT D 25 HYDROXY: 55.4 ng/mL (ref 30.0–100.0)

## 2016-01-14 LAB — CMP14+EGFR
A/G RATIO: 1.7 (ref 1.2–2.2)
ALBUMIN: 3.9 g/dL (ref 3.5–5.5)
ALK PHOS: 64 IU/L (ref 39–117)
ALT: 19 IU/L (ref 0–44)
AST: 18 IU/L (ref 0–40)
BUN / CREAT RATIO: 21 — AB (ref 9–20)
BUN: 20 mg/dL (ref 6–24)
CALCIUM: 9.1 mg/dL (ref 8.7–10.2)
CO2: 24 mmol/L (ref 18–29)
CREATININE: 0.97 mg/dL (ref 0.76–1.27)
Chloride: 100 mmol/L (ref 96–106)
GFR calc Af Amer: 101 mL/min/{1.73_m2} (ref >59)
GFR calc non Af Amer: 88 mL/min/{1.73_m2} (ref >59)
GLOBULIN, TOTAL: 2.3 g/dL (ref 1.5–4.5)
GLUCOSE: 89 mg/dL (ref 65–99)
Potassium: 4.8 mmol/L (ref 3.5–5.2)
SODIUM: 141 mmol/L (ref 134–144)
Total Protein: 6.2 g/dL (ref 6.0–8.5)

## 2016-01-14 LAB — THYROID PANEL WITH TSH
Free Thyroxine Index: 2 (ref 1.2–4.9)
T3 Uptake Ratio: 29 % (ref 24–39)
T4, Total: 6.9 ug/dL (ref 4.5–12.0)
TSH: 0.337 u[IU]/mL — AB (ref 0.450–4.500)

## 2016-01-14 LAB — PSA, TOTAL AND FREE
PSA FREE PCT: 35 %
PSA FREE: 0.07 ng/mL
Prostate Specific Ag, Serum: 0.2 ng/mL (ref 0.0–4.0)

## 2016-01-14 LAB — MICROSCOPIC EXAMINATION
Bacteria, UA: NONE SEEN
EPITHELIAL CELLS (NON RENAL): NONE SEEN /HPF (ref 0–10)
RBC MICROSCOPIC, UA: NONE SEEN /HPF (ref 0–?)
WBC, UA: NONE SEEN /hpf (ref 0–?)

## 2016-01-14 NOTE — Patient Instructions (Addendum)
Continue current medications. Continue good therapeutic lifestyle changes which include good diet and exercise. Fall precautions discussed with patient. If an FOBT was given today- please return it to our front desk. If you are over 56 years old - you may need Prevnar 76 or the adult Pneumonia vaccine.   After your visit with [place] today you will receive a survey in the mail or online from [employer] regarding your care with [place]. Please take a moment to fill this out. Your feedback is very important to [place] as you can help [place] better understand your patient needs as well as improve your experience and satisfaction. WE CARE ABOUT YOU!!!   The patient will have his TSH and thyroid repeated in about 4 weeks He will have a traditional lipid liver panel in about 3-4 months after making more efforts at watching his diet and getting more exercise with a goal of getting the LDL particle number less than 1000 and the LDL C less than 100. He will continue to follow-up with his routine colonoscopies We will call with the urinalysis result as soon as that becomes available

## 2016-01-14 NOTE — Addendum Note (Signed)
Addended by: Zannie Cove on: 01/14/2016 04:59 PM   Modules accepted: Orders

## 2016-01-14 NOTE — Progress Notes (Signed)
Subjective:    Patient ID: Joseph Oliver, male    DOB: 03-09-1961, 55 y.o.   MRN: [id number]  HPI Patient is here today for annual wellness exam and follow up of chronic medical problems which includes hyperlipidemia. He is taking medications regularly. Patient has had colonoscopies and endoscopies. He is due for another colonoscopy 3 years after the last one according to him. He has had some colon polyps and he is had some gastritis. He continues to take iron. His recent lab work was reviewed with him today. All of his numbers were good except the TSH was slightly decreased and the HDL was low and the LDL particle number was slightly increased but lower than it has been in the past. He is aware that he will need a repeat thyroid profile in about 4 weeks and repeat lipid liver panel in about 4 months. The patient denies any chest pain shortness of breath trouble swallowing nausea vomiting diarrhea or blood in the stool. He does have occasional heartburn and indigestion. He continues to take ranitidine twice daily for this. He also says he takes occasionally ibuprofen and was not aware that this could irritate his stomach and increase his heartburn and gastritis issues. He has no specific problems with voiding other than his stream is not as full as it used to be.       Patient Active Problem List   Diagnosis Date Noted  . Cervicalgia 07/17/2015  . Vitamin D deficiency 11/08/2014  . Generalized anxiety disorder 01/19/2013  . Iron deficiency anemia 11/14/2011  . Hyperlipidemia 11/14/2011  . GERD (gastroesophageal reflux disease) 11/14/2011   Outpatient Encounter Prescriptions as of 01/14/2016  Medication Sig  . calcium-vitamin D (OSCAL 500/200 D-3) 500-200 MG-UNIT per tablet Take 1 tablet by mouth daily.  . Ferrous Sulfate (IRON) 325 (65 FE) MG TABS Take 1 tablet by mouth daily.  . Multiple Vitamins-Minerals (CENTRUM SILVER PO) Take 1 capsule by mouth daily.  . ranitidine (ZANTAC) 150 MG  tablet Take 150 mg by mouth 2 (two) times daily.  . tizanidine (ZANAFLEX) 2 MG capsule TAKE 2 CAPSULES BY MOUTH EVERY 6 HOURS  . VITAMIN D, CHOLECALCIFEROL, PO Take by mouth as directed.  . methocarbamol (ROBAXIN) 500 MG tablet TAKE 1 TABLET (500 MG TOTAL) BY MOUTH 2 (TWO) TIMES DAILY AS NEEDED FOR MUSCLE SPASMS. (Patient not taking: Reported on 11/30/2015)  . [DISCONTINUED] methocarbamol (ROBAXIN) 500 MG tablet TAKE 1 TABLET (500 MG TOTAL) BY MOUTH 2 (TWO) TIMES DAILY AS NEEDED FOR MUSCLE SPASMS.  . [DISCONTINUED] oxyCODONE-acetaminophen (ROXICET) 5-325 MG tablet Take 1 tablet by mouth every 4 (four) hours as needed for moderate pain or severe pain. (Patient not taking: Reported on 11/30/2015)   No facility-administered encounter medications on file as of 01/14/2016.       Review of Systems  Constitutional: Negative.   HENT: Negative.   Eyes: Negative.   Respiratory: Negative.   Cardiovascular: Negative.   Gastrointestinal: Negative.   Endocrine: Negative.   Genitourinary: Negative.   Musculoskeletal: Negative.   Skin: Negative.   Allergic/Immunologic: Negative.   Neurological: Negative.   Hematological: Negative.   Psychiatric/Behavioral: Negative.        Objective:   Physical Exam  Constitutional: He is oriented to person, place, and time. He appears well-developed and well-nourished. No distress.  HENT:  Head: Normocephalic and atraumatic.  Right Ear: External ear normal.  Left Ear: External ear normal.  Nose: Nose normal.  Mouth/Throat: Oropharynx is clear and moist. No  oropharyngeal exudate.  Eyes: Conjunctivae and EOM are normal. Pupils are equal, round, and reactive to light. Right eye exhibits no discharge. Left eye exhibits no discharge. No scleral icterus.  The patient stays up-to-date on his eye exams  Neck: Normal range of motion. Neck supple. No tracheal deviation present. No thyromegaly present.  There are no bruits thyromegaly or anterior cervical adenopathy    Cardiovascular: Normal rate, regular rhythm, normal heart sounds and intact distal pulses.   No murmur heard. The heart has a regular rate and rhythm at 84/m  Pulmonary/Chest: Effort normal and breath sounds normal. No respiratory distress. He has no wheezes. He has no rales. He exhibits no tenderness.  Clear anteriorly and posteriorly  Abdominal: Soft. Bowel sounds are normal. He exhibits no mass. There is no tenderness. There is no rebound and no guarding.  No epigastric tenderness liver or spleen enlargement or masses  Genitourinary: Rectum normal and penis normal.  Genitourinary Comments: The prostate was slightly enlarged without lumps or masses. The rectal exam was clear of masses. There are no inguinal hernias palpable and there were no inguinal nodes palpable. The external genitalia were within normal limits.  Musculoskeletal: Normal range of motion. He exhibits no edema.  Lymphadenopathy:    He has no cervical adenopathy.  Neurological: He is alert and oriented to person, place, and time. He has normal reflexes. No cranial nerve deficit.  Skin: Skin is warm and dry. No rash noted.  Psychiatric: He has a normal mood and affect. His behavior is normal. Judgment and thought content normal.  Nursing note and vitals reviewed.   BP 119/76 (BP Location: Left Arm)   Pulse 69   Temp 98.6 F (37 C) (Oral)   Ht 5\' 9"  (1.753 m)   Wt 184 lb (83.5 kg)   BMI 27.17 kg/m        Assessment & Plan:  1. Annual physical exam -Continue with aggressive therapeutic lifestyle changes which include diet and exercise - Urinalysis, Complete  2. Vitamin D deficiency -Vitamin D level was good and he will continue with current treatment  3. Gastroesophageal reflux disease, esophagitis presence not specified -The patient rarely has any reflux symptoms unless he misses taking his ranitidine. He is taking 150 mg twice daily before breakfast and supper currently. He will continue with this.  4. BPH  (benign prostatic hyperplasia) -The patient's stream is slower than it has been in the past but the prostate exam was negative for lumps or masses but was slightly enlarged. and the PSA was low. - Urinalysis, Complete  5. Iron deficiency anemia -Continue with iron replacement and regular follow-up with gastroenterologist for colonoscopy  Patient Instructions  Continue current medications. Continue good therapeutic lifestyle changes which include good diet and exercise. Fall precautions discussed with patient. If an FOBT was given today- please return it to our front desk. If you are over 74 years old - you may need Prevnar 31 or the adult Pneumonia vaccine.   After your visit with [place] today you will receive a survey in the mail or online from [employer] regarding your care with [place]. Please take a moment to fill this out. Your feedback is very important to [place] as you can help [place] better understand your patient needs as well as improve your experience and satisfaction. WE CARE ABOUT YOU!!!   The patient will have his TSH and thyroid repeated in about 4 weeks He will have a traditional lipid liver panel in about 3-4 months after  making more efforts at watching his diet and getting more exercise with a goal of getting the LDL particle number less than 1000 and the LDL C less than 100. He will continue to follow-up with his routine colonoscopies We will call with the urinalysis result as soon as that becomes available    Arrie Senate MD

## 2016-03-21 DIAGNOSIS — H5203 Hypermetropia, bilateral: Secondary | ICD-10-CM | POA: Diagnosis not present

## 2016-05-18 ENCOUNTER — Other Ambulatory Visit: Payer: Self-pay | Admitting: Family Medicine

## 2016-05-29 ENCOUNTER — Telehealth: Payer: Self-pay | Admitting: Family Medicine

## 2016-05-29 DIAGNOSIS — D509 Iron deficiency anemia, unspecified: Secondary | ICD-10-CM

## 2016-05-29 DIAGNOSIS — Z0000 Encounter for general adult medical examination without abnormal findings: Secondary | ICD-10-CM

## 2016-05-29 DIAGNOSIS — E785 Hyperlipidemia, unspecified: Secondary | ICD-10-CM

## 2016-05-29 DIAGNOSIS — E559 Vitamin D deficiency, unspecified: Secondary | ICD-10-CM

## 2016-05-29 NOTE — Telephone Encounter (Signed)
Lab orders placed and pt aware 

## 2016-05-31 ENCOUNTER — Other Ambulatory Visit: Payer: BLUE CROSS/BLUE SHIELD

## 2016-05-31 DIAGNOSIS — R899 Unspecified abnormal finding in specimens from other organs, systems and tissues: Secondary | ICD-10-CM | POA: Diagnosis not present

## 2016-05-31 DIAGNOSIS — E559 Vitamin D deficiency, unspecified: Secondary | ICD-10-CM

## 2016-05-31 DIAGNOSIS — Z0000 Encounter for general adult medical examination without abnormal findings: Secondary | ICD-10-CM | POA: Diagnosis not present

## 2016-05-31 DIAGNOSIS — E785 Hyperlipidemia, unspecified: Secondary | ICD-10-CM | POA: Diagnosis not present

## 2016-05-31 DIAGNOSIS — D509 Iron deficiency anemia, unspecified: Secondary | ICD-10-CM

## 2016-06-02 LAB — CMP14+EGFR
ALBUMIN: 4.6 g/dL (ref 3.5–5.5)
ALT: 18 IU/L (ref 0–44)
AST: 17 IU/L (ref 0–40)
Albumin/Globulin Ratio: 1.9 (ref 1.2–2.2)
Alkaline Phosphatase: 72 IU/L (ref 39–117)
BILIRUBIN TOTAL: 0.5 mg/dL (ref 0.0–1.2)
BUN / CREAT RATIO: 17 (ref 9–20)
BUN: 15 mg/dL (ref 6–24)
CHLORIDE: 102 mmol/L (ref 96–106)
CO2: 30 mmol/L — ABNORMAL HIGH (ref 18–29)
Calcium: 9.7 mg/dL (ref 8.7–10.2)
Creatinine, Ser: 0.88 mg/dL (ref 0.76–1.27)
GFR calc non Af Amer: 96 mL/min/{1.73_m2} (ref >59)
GFR, EST AFRICAN AMERICAN: 111 mL/min/{1.73_m2} (ref >59)
Globulin, Total: 2.4 g/dL (ref 1.5–4.5)
Glucose: 96 mg/dL (ref 65–99)
POTASSIUM: 5.3 mmol/L — AB (ref 3.5–5.2)
SODIUM: 142 mmol/L (ref 134–144)
TOTAL PROTEIN: 7 g/dL (ref 6.0–8.5)

## 2016-06-02 LAB — CBC WITH DIFFERENTIAL/PLATELET
Basophils Absolute: 0 10*3/uL (ref 0.0–0.2)
Basos: 0 %
EOS (ABSOLUTE): 0.1 10*3/uL (ref 0.0–0.4)
Eos: 2 %
Hematocrit: 44.5 % (ref 37.5–51.0)
Hemoglobin: 15.1 g/dL (ref 13.0–17.7)
IMMATURE GRANS (ABS): 0 10*3/uL (ref 0.0–0.1)
Immature Granulocytes: 0 %
LYMPHS: 42 %
Lymphocytes Absolute: 3.1 10*3/uL (ref 0.7–3.1)
MCH: 30.1 pg (ref 26.6–33.0)
MCHC: 33.9 g/dL (ref 31.5–35.7)
MCV: 89 fL (ref 79–97)
MONOS ABS: 0.7 10*3/uL (ref 0.1–0.9)
Monocytes: 10 %
NEUTROS ABS: 3.4 10*3/uL (ref 1.4–7.0)
Neutrophils: 46 %
PLATELETS: 315 10*3/uL (ref 150–379)
RBC: 5.02 x10E6/uL (ref 4.14–5.80)
RDW: 14.1 % (ref 12.3–15.4)
WBC: 7.4 10*3/uL (ref 3.4–10.8)

## 2016-06-02 LAB — LIPID PANEL
CHOL/HDL RATIO: 3.4 ratio (ref 0.0–5.0)
Cholesterol, Total: 233 mg/dL — ABNORMAL HIGH (ref 100–199)
HDL: 68 mg/dL (ref >39)
LDL Calculated: 153 mg/dL — ABNORMAL HIGH (ref 0–99)
Triglycerides: 61 mg/dL (ref 0–149)
VLDL Cholesterol Cal: 12 mg/dL (ref 5–40)

## 2016-06-02 LAB — VITAMIN D 25 HYDROXY (VIT D DEFICIENCY, FRACTURES): Vit D, 25-Hydroxy: 59.8 ng/mL (ref 30.0–100.0)

## 2016-06-02 LAB — THYROID PANEL WITH TSH
Free Thyroxine Index: 1.7 (ref 1.2–4.9)
T3 UPTAKE RATIO: 25 % (ref 24–39)
T4 TOTAL: 6.8 ug/dL (ref 4.5–12.0)
TSH: 0.711 u[IU]/mL (ref 0.450–4.500)

## 2016-06-02 LAB — HEPATIC FUNCTION PANEL: BILIRUBIN, DIRECT: 0.14 mg/dL (ref 0.00–0.40)

## 2016-06-02 MED ORDER — ROSUVASTATIN CALCIUM 10 MG PO TABS: 10.0000 mg | ORAL_TABLET | Freq: Every day | ORAL | 0 refills | Status: DC

## 2016-08-26 ENCOUNTER — Other Ambulatory Visit: Payer: Self-pay | Admitting: Family Medicine

## 2016-09-22 ENCOUNTER — Other Ambulatory Visit: Payer: Self-pay | Admitting: *Deleted

## 2016-09-22 ENCOUNTER — Telehealth: Payer: Self-pay | Admitting: Family Medicine

## 2016-09-22 DIAGNOSIS — E785 Hyperlipidemia, unspecified: Secondary | ICD-10-CM

## 2016-09-22 DIAGNOSIS — D509 Iron deficiency anemia, unspecified: Secondary | ICD-10-CM

## 2016-09-22 DIAGNOSIS — K219 Gastro-esophageal reflux disease without esophagitis: Secondary | ICD-10-CM

## 2016-09-22 NOTE — Telephone Encounter (Signed)
Returned patient's phone call.  Patient was to come in 4-6 weeks after starting Crestor and did not.  Labs ordered and patient will come in sometime this week to have them drawn

## 2016-09-23 ENCOUNTER — Other Ambulatory Visit: Payer: BLUE CROSS/BLUE SHIELD

## 2016-09-23 DIAGNOSIS — K219 Gastro-esophageal reflux disease without esophagitis: Secondary | ICD-10-CM | POA: Diagnosis not present

## 2016-09-23 DIAGNOSIS — E785 Hyperlipidemia, unspecified: Secondary | ICD-10-CM | POA: Diagnosis not present

## 2016-09-23 DIAGNOSIS — D509 Iron deficiency anemia, unspecified: Secondary | ICD-10-CM

## 2016-09-24 ENCOUNTER — Other Ambulatory Visit: Payer: Self-pay | Admitting: *Deleted

## 2016-09-24 LAB — CMP14+EGFR
ALT: 22 IU/L (ref 0–44)
AST: 20 IU/L (ref 0–40)
Albumin/Globulin Ratio: 1.7 (ref 1.2–2.2)
Albumin: 4.3 g/dL (ref 3.5–5.5)
Alkaline Phosphatase: 65 IU/L (ref 39–117)
BUN/Creatinine Ratio: 15 (ref 9–20)
BUN: 14 mg/dL (ref 6–24)
Bilirubin Total: 0.3 mg/dL (ref 0.0–1.2)
CO2: 27 mmol/L (ref 18–29)
Calcium: 9.2 mg/dL (ref 8.7–10.2)
Chloride: 98 mmol/L (ref 96–106)
Creatinine, Ser: 0.94 mg/dL (ref 0.76–1.27)
GFR calc Af Amer: 104 mL/min/1.73 (ref >59)
GFR calc non Af Amer: 90 mL/min/1.73 (ref >59)
Globulin, Total: 2.5 g/dL (ref 1.5–4.5)
Glucose: 95 mg/dL (ref 65–99)
Potassium: 5 mmol/L (ref 3.5–5.2)
Sodium: 140 mmol/L (ref 134–144)
Total Protein: 6.8 g/dL (ref 6.0–8.5)

## 2016-09-24 MED ORDER — ROSUVASTATIN CALCIUM 10 MG PO TABS: 10.0000 mg | ORAL_TABLET | Freq: Every day | ORAL | 3 refills | Status: DC

## 2016-10-17 ENCOUNTER — Other Ambulatory Visit: Payer: Self-pay | Admitting: Family Medicine

## 2016-10-20 NOTE — Telephone Encounter (Signed)
I will be glad to see him to consider this request.

## 2016-10-24 ENCOUNTER — Other Ambulatory Visit: Payer: Self-pay | Admitting: Family Medicine

## 2016-10-24 NOTE — Telephone Encounter (Signed)
Appointment made

## 2016-10-27 ENCOUNTER — Ambulatory Visit (INDEPENDENT_AMBULATORY_CARE_PROVIDER_SITE_OTHER): Payer: BLUE CROSS/BLUE SHIELD | Admitting: Family Medicine

## 2016-10-27 ENCOUNTER — Encounter: Payer: Self-pay | Admitting: Family Medicine

## 2016-10-27 VITALS — BP 126/68 | HR 67 | Temp 100.7°F | Ht 69.0 in | Wt 187.0 lb

## 2016-10-27 DIAGNOSIS — J029 Acute pharyngitis, unspecified: Secondary | ICD-10-CM

## 2016-10-27 DIAGNOSIS — K219 Gastro-esophageal reflux disease without esophagitis: Secondary | ICD-10-CM

## 2016-10-27 DIAGNOSIS — D509 Iron deficiency anemia, unspecified: Secondary | ICD-10-CM | POA: Diagnosis not present

## 2016-10-27 DIAGNOSIS — E785 Hyperlipidemia, unspecified: Secondary | ICD-10-CM

## 2016-10-27 DIAGNOSIS — F411 Generalized anxiety disorder: Secondary | ICD-10-CM

## 2016-10-27 LAB — CULTURE, GROUP A STREP

## 2016-10-27 LAB — RAPID STREP SCREEN (MED CTR MEBANE ONLY): STREP GP A AG, IA W/REFLEX: NEGATIVE

## 2016-10-27 MED ORDER — METHOCARBAMOL 500 MG PO TABS: ORAL_TABLET | ORAL | 5 refills | Status: DC

## 2016-10-27 NOTE — Progress Notes (Signed)
Subjective:  Patient ID: Joseph Oliver, male    DOB: [birth year]  Age: 56 y.o. MRN: [id number]  CC: Medication Refill (pt here today for refills on his routine medications)   HPI Joseph Oliver presents for follow-up of elevated cholesterol. Doing well without complaints on current medication. Denies side effects of statin including myalgia and arthralgia and nausea. Also in today for liver function testing. Currently no chest pain, shortness of breath or other cardiovascular related symptoms noted.He does have some chronic neck pain that is well relieved by daily use of Robaxin. However, on occasion and he has to use ties and is seen later in the day if symptoms persist. On even more rare occasions he will take a Percocet. He has about half a bottle of Percocet left from his previous prescription that was given several months ago. He brings this in to demonstrate how rare his use of that that is and to reassure me that he is not misusing them. Patient also is noted to have a fever. However he reports being asymptomatic. He does not have a sore throat but he notes that he has a contact that has been exposed to strep. He is wondering if the fevers is for symptom and sore throat will follow. He requests a strep test.  History Joseph Oliver has a past medical history of Anemia; GERD (gastroesophageal reflux disease); Hyperlipemia; and Vitamin D deficiency.   He has a past surgical history that includes Rhinoplasty; Spinal fusion; Neck surgery; and Hand surgery.   His family history includes Alzheimer's disease in his mother; Cancer in his mother; Drug abuse in his mother.He reports that he has never smoked. He has never used smokeless tobacco. He reports that he drinks alcohol. He reports that he does not use drugs.  Current Outpatient Prescriptions on File Prior to Visit  Medication Sig Dispense Refill  . calcium-vitamin D (OSCAL 500/200 D-3) 500-200 MG-UNIT per tablet Take 1 tablet by mouth  daily.    . Ferrous Sulfate (IRON) 325 (65 FE) MG TABS Take 1 tablet by mouth daily.    . Multiple Vitamins-Minerals (CENTRUM SILVER PO) Take 1 capsule by mouth daily.    . ranitidine (ZANTAC) 150 MG tablet Take 150 mg by mouth 2 (two) times daily.    . rosuvastatin (CRESTOR) 10 MG tablet Take 1 tablet (10 mg total) by mouth at bedtime. 90 tablet 3  . tizanidine (ZANAFLEX) 2 MG capsule TAKE 2 CAPSULES BY MOUTH EVERY 6 HOURS  1  . VITAMIN D, CHOLECALCIFEROL, PO Take by mouth as directed.     No current facility-administered medications on file prior to visit.     ROS Review of Systems  Constitutional: Negative for chills, diaphoresis and fever (that is, none other than that noted on todays evaluation).  HENT: Negative for rhinorrhea and sore throat.   Respiratory: Negative for cough and shortness of breath.   Cardiovascular: Negative for chest pain.  Gastrointestinal: Negative for abdominal pain.  Musculoskeletal: Negative for arthralgias and myalgias.  Skin: Negative for rash.  Neurological: Negative for weakness and headaches.    Objective:  BP 126/68   Pulse 67   Temp (!) 100.7 F (38.2 C) (Oral)   Ht '5\' 9"'$  (1.753 m)   Wt 187 lb (84.8 kg)   BMI 27.62 kg/m   BP Readings from Last 3 Encounters:  10/27/16 126/68  01/14/16 119/76  11/30/15 121/68    Wt Readings from Last 3 Encounters:  10/27/16 187 lb (84.8 kg)  01/14/16 184 lb (83.5 kg)  11/30/15 188 lb (85.3 kg)     Physical Exam  Constitutional: He appears well-developed and well-nourished.  HENT:  Head: Normocephalic and atraumatic.  Right Ear: Tympanic membrane and external ear normal. No decreased hearing is noted.  Left Ear: Tympanic membrane and external ear normal. No decreased hearing is noted.  Mouth/Throat: No oropharyngeal exudate or posterior oropharyngeal erythema.  Eyes: Pupils are equal, round, and reactive to light.  Neck: Normal range of motion. Neck supple.  Cardiovascular: Normal rate and  regular rhythm.   No murmur heard. Pulmonary/Chest: Breath sounds normal. No respiratory distress.  Abdominal: Soft. Bowel sounds are normal. He exhibits no mass. There is no tenderness.  Vitals reviewed.   No results found for: HGBA1C  Lab Results  Component Value Date   WBC 7.4 05/31/2016   HGB 15.3 11/25/2012   HCT 44.5 05/31/2016   PLT 315 05/31/2016   GLUCOSE 95 09/23/2016   CHOL 233 (H) 05/31/2016   TRIG 61 05/31/2016   HDL 68 05/31/2016   LDLCALC 153 (H) 05/31/2016   ALT 22 09/23/2016   AST 20 09/23/2016   NA 140 09/23/2016   K 5.0 09/23/2016   CL 98 09/23/2016   CREATININE 0.94 09/23/2016   BUN 14 09/23/2016   CO2 27 09/23/2016   TSH 0.711 05/31/2016    No results found.  Assessment & Plan:   Joseph Oliver was seen today for medication refill.  Diagnoses and all orders for this visit:  Hyperlipidemia, unspecified hyperlipidemia type -     CBC with Differential/Platelet -     CMP14+EGFR -     Lipid panel  Gastroesophageal reflux disease, esophagitis presence not specified -     CBC with Differential/Platelet -     CMP14+EGFR  Generalized anxiety disorder -     CBC with Differential/Platelet -     CMP14+EGFR  Iron deficiency anemia, unspecified iron deficiency anemia type  Sore throat -     Rapid strep screen (not at [facility])  Other orders -     methocarbamol (ROBAXIN) 500 MG tablet; TAKE 1 TABLET (500 MG TOTAL) BY MOUTH  Four TIMES DAILY AS NEEDED FOR MUSCLE SPASMS. -     Culture, Group A Strep   I have changed Joseph Oliver methocarbamol. I am also having him maintain his Multiple Vitamins-Minerals (CENTRUM SILVER PO), calcium-vitamin D, (VITAMIN D, CHOLECALCIFEROL, PO), Iron, ranitidine, tizanidine, and rosuvastatin.  Meds ordered this encounter  Medications  . methocarbamol (ROBAXIN) 500 MG tablet    Sig: TAKE 1 TABLET (500 MG TOTAL) BY MOUTH  Four TIMES DAILY AS NEEDED FOR MUSCLE SPASMS.    Dispense:  120 tablet    Refill:  5     Follow-up:  Return in about 6 months (around 04/29/2017).  Claretta Fraise, M.D.

## 2016-11-15 ENCOUNTER — Other Ambulatory Visit: Payer: BLUE CROSS/BLUE SHIELD

## 2016-11-15 DIAGNOSIS — E784 Other hyperlipidemia: Secondary | ICD-10-CM | POA: Diagnosis not present

## 2016-11-15 DIAGNOSIS — D508 Other iron deficiency anemias: Secondary | ICD-10-CM

## 2016-11-15 DIAGNOSIS — E7849 Other hyperlipidemia: Secondary | ICD-10-CM

## 2016-11-16 LAB — CBC WITH DIFFERENTIAL/PLATELET
BASOS ABS: 0 10*3/uL (ref 0.0–0.2)
Basos: 0 %
EOS (ABSOLUTE): 0.1 10*3/uL (ref 0.0–0.4)
Eos: 2 %
HEMOGLOBIN: 14.7 g/dL (ref 13.0–17.7)
Hematocrit: 44.5 % (ref 37.5–51.0)
Immature Grans (Abs): 0 10*3/uL (ref 0.0–0.1)
Immature Granulocytes: 0 %
LYMPHS ABS: 2.6 10*3/uL (ref 0.7–3.1)
Lymphs: 38 %
MCH: 29.2 pg (ref 26.6–33.0)
MCHC: 33 g/dL (ref 31.5–35.7)
MCV: 88 fL (ref 79–97)
Monocytes Absolute: 0.6 10*3/uL (ref 0.1–0.9)
Monocytes: 9 %
NEUTROS ABS: 3.4 10*3/uL (ref 1.4–7.0)
Neutrophils: 51 %
PLATELETS: 277 10*3/uL (ref 150–379)
RBC: 5.04 x10E6/uL (ref 4.14–5.80)
RDW: 13.9 % (ref 12.3–15.4)
WBC: 6.7 10*3/uL (ref 3.4–10.8)

## 2016-11-16 LAB — CMP14+EGFR
A/G RATIO: 1.8 (ref 1.2–2.2)
ALBUMIN: 4.3 g/dL (ref 3.5–5.5)
ALK PHOS: 66 IU/L (ref 39–117)
ALT: 28 IU/L (ref 0–44)
AST: 26 IU/L (ref 0–40)
BILIRUBIN TOTAL: 0.3 mg/dL (ref 0.0–1.2)
BUN / CREAT RATIO: 19 (ref 9–20)
BUN: 18 mg/dL (ref 6–24)
CHLORIDE: 102 mmol/L (ref 96–106)
CO2: 25 mmol/L (ref 18–29)
Calcium: 9.2 mg/dL (ref 8.7–10.2)
Creatinine, Ser: 0.93 mg/dL (ref 0.76–1.27)
GFR calc Af Amer: 106 mL/min/{1.73_m2} (ref >59)
GFR calc non Af Amer: 91 mL/min/{1.73_m2} (ref >59)
GLUCOSE: 94 mg/dL (ref 65–99)
Globulin, Total: 2.4 g/dL (ref 1.5–4.5)
POTASSIUM: 4.5 mmol/L (ref 3.5–5.2)
SODIUM: 140 mmol/L (ref 134–144)
Total Protein: 6.7 g/dL (ref 6.0–8.5)

## 2016-11-16 LAB — LIPID PANEL
CHOLESTEROL TOTAL: 154 mg/dL (ref 100–199)
Chol/HDL Ratio: 2.5 ratio (ref 0.0–5.0)
HDL: 61 mg/dL (ref >39)
LDL Calculated: 81 mg/dL (ref 0–99)
Triglycerides: 58 mg/dL (ref 0–149)
VLDL CHOLESTEROL CAL: 12 mg/dL (ref 5–40)

## 2017-01-14 ENCOUNTER — Ambulatory Visit: Payer: BLUE CROSS/BLUE SHIELD | Admitting: Family Medicine

## 2017-01-15 ENCOUNTER — Telehealth: Payer: Self-pay | Admitting: Family Medicine

## 2017-01-15 ENCOUNTER — Encounter: Payer: Self-pay | Admitting: Family Medicine

## 2017-01-16 ENCOUNTER — Encounter (INDEPENDENT_AMBULATORY_CARE_PROVIDER_SITE_OTHER): Payer: Self-pay

## 2017-01-16 ENCOUNTER — Ambulatory Visit (INDEPENDENT_AMBULATORY_CARE_PROVIDER_SITE_OTHER): Payer: BLUE CROSS/BLUE SHIELD | Admitting: Family Medicine

## 2017-01-16 ENCOUNTER — Encounter: Payer: Self-pay | Admitting: Family Medicine

## 2017-01-16 VITALS — BP 144/81 | HR 70 | Temp 98.9°F | Ht 69.0 in | Wt 177.0 lb

## 2017-01-16 DIAGNOSIS — F4323 Adjustment disorder with mixed anxiety and depressed mood: Secondary | ICD-10-CM | POA: Diagnosis not present

## 2017-01-16 MED ORDER — HYDROXYZINE HCL 25 MG PO TABS: 25.0000 mg | ORAL_TABLET | Freq: Three times a day (TID) | ORAL | 2 refills | Status: DC | PRN

## 2017-01-16 MED ORDER — TRAZODONE HCL 50 MG PO TABS: 25.0000 mg | ORAL_TABLET | Freq: Every evening | ORAL | 3 refills | Status: DC | PRN

## 2017-01-16 NOTE — Progress Notes (Signed)
BP (!) 144/81   Pulse 70   Temp 98.9 F (37.2 C) (Oral)   Ht 5\' 9"  (1.753 m)   Wt 177 lb (80.3 kg)   BMI 26.14 kg/m    Subjective:    Patient ID: Joseph Oliver, male    DOB: 03-15-1961, 56 y.o.   MRN: [id number]  HPI: Joseph Oliver is a 56 y.o. male presenting on 01/16/2017 for Anxiety   HPI Anxiety and lost his job Patient has been feeling a lot of anxiety and some depression and generally just feeling bad since he lost his job 2 weeks ago. He worked at that job for 34 years and then was let go because of some complaints against him, he did not understand what the complaints were and is having a meeting this coming week to discuss that further. He generally just feels sad and depressed and anxious because of everything that is going on. He is also concerned about his financial situation because she has never worked anything different than this job in his life. He denies any suicidal ideations or thoughts of hurting himself. Depression screen [facility] 2/9 01/16/2017 10/27/2016 01/14/2016 11/30/2015 11/20/2014  Decreased Interest 0 0 0 0 0  Down, Depressed, Hopeless 0 0 0 0 0  PHQ - 2 Score 0 0 0 0 0     Relevant past medical, surgical, family and social history reviewed and updated as indicated. Interim medical history since our last visit reviewed. Allergies and medications reviewed and updated.  Review of Systems  Constitutional: Negative for chills and fever.  Eyes: Negative for discharge.  Respiratory: Negative for shortness of breath and wheezing.   Cardiovascular: Negative for chest pain and leg swelling.  Musculoskeletal: Negative for back pain and gait problem.  Skin: Negative for rash.  Psychiatric/Behavioral: Positive for decreased concentration, dysphoric mood and sleep disturbance. Negative for self-injury and suicidal ideas. The patient is nervous/anxious.   All other systems reviewed and are negative.   Per HPI unless specifically indicated above          Objective:    BP (!) 144/81   Pulse 70   Temp 98.9 F (37.2 C) (Oral)   Ht 5\' 9"  (1.753 m)   Wt 177 lb (80.3 kg)   BMI 26.14 kg/m   Wt Readings from Last 3 Encounters:  01/16/17 177 lb (80.3 kg)  10/27/16 187 lb (84.8 kg)  01/14/16 184 lb (83.5 kg)    Physical Exam  Constitutional: He is oriented to person, place, and time. He appears well-developed and well-nourished. No distress.  Eyes: Conjunctivae are normal. No scleral icterus.  Neck: Neck supple. No thyromegaly present.  Cardiovascular: Normal rate, regular rhythm, normal heart sounds and intact distal pulses.   No murmur heard. Pulmonary/Chest: Effort normal and breath sounds normal. No respiratory distress. He has no wheezes. He has no rales.  Musculoskeletal: Normal range of motion. He exhibits no edema.  Lymphadenopathy:    He has no cervical adenopathy.  Neurological: He is alert and oriented to person, place, and time. Coordination normal.  Skin: Skin is warm and dry. No rash noted. He is not diaphoretic.  Psychiatric: His behavior is normal. Judgment normal. His mood appears anxious. He exhibits a depressed mood. He expresses no suicidal ideation. He expresses no suicidal plans.  Nursing note and vitals reviewed.     Assessment & Plan:   Problem List Items Addressed This Visit    None    Visit Diagnoses  Adjustment disorder with mixed anxiety and depressed mood    -  Primary   Lost his job 2 weeks ago, not sleeping, not doing well, does not know why he lost his job   Relevant Medications   traZODone (DESYREL) 50 MG tablet   hydrOXYzine (ATARAX/VISTARIL) 25 MG tablet      Follow up plan: Return if symptoms worsen or fail to improve.  Counseling provided for all of the vaccine components No orders of the defined types were placed in this encounter.   Caryl Pina, MD [facility] 01/16/2017, 2:49 PM

## 2017-01-19 NOTE — Telephone Encounter (Signed)
Aware to call for reschedule

## 2017-03-02 ENCOUNTER — Other Ambulatory Visit: Payer: Self-pay | Admitting: Family Medicine

## 2017-03-02 MED ORDER — TIZANIDINE HCL 4 MG PO CAPS: 4.0000 mg | ORAL_CAPSULE | Freq: Three times a day (TID) | ORAL | 2 refills | Status: DC | PRN

## 2017-06-23 ENCOUNTER — Other Ambulatory Visit: Payer: Self-pay | Admitting: Family Medicine

## 2017-06-23 DIAGNOSIS — F4323 Adjustment disorder with mixed anxiety and depressed mood: Secondary | ICD-10-CM

## 2017-06-23 NOTE — Telephone Encounter (Signed)
Last seen 01/16/17 [facility]

## 2017-08-13 ENCOUNTER — Other Ambulatory Visit: Payer: Self-pay | Admitting: *Deleted

## 2017-08-13 DIAGNOSIS — F4323 Adjustment disorder with mixed anxiety and depressed mood: Secondary | ICD-10-CM

## 2017-08-13 MED ORDER — TRAZODONE HCL 50 MG PO TABS: 25.0000 mg | ORAL_TABLET | Freq: Every evening | ORAL | 0 refills | Status: DC | PRN

## 2017-09-09 ENCOUNTER — Other Ambulatory Visit: Payer: Self-pay | Admitting: Family Medicine

## 2017-09-20 ENCOUNTER — Other Ambulatory Visit: Payer: Self-pay | Admitting: Nurse Practitioner

## 2017-09-20 DIAGNOSIS — F4323 Adjustment disorder with mixed anxiety and depressed mood: Secondary | ICD-10-CM

## 2017-10-20 ENCOUNTER — Other Ambulatory Visit: Payer: Self-pay | Admitting: Family Medicine

## 2017-10-20 DIAGNOSIS — F4323 Adjustment disorder with mixed anxiety and depressed mood: Secondary | ICD-10-CM

## 2017-11-02 ENCOUNTER — Other Ambulatory Visit: Payer: Self-pay | Admitting: Family Medicine

## 2017-11-02 ENCOUNTER — Encounter: Payer: Self-pay | Admitting: Family Medicine

## 2017-11-02 ENCOUNTER — Ambulatory Visit (INDEPENDENT_AMBULATORY_CARE_PROVIDER_SITE_OTHER): Payer: BLUE CROSS/BLUE SHIELD

## 2017-11-02 ENCOUNTER — Ambulatory Visit (INDEPENDENT_AMBULATORY_CARE_PROVIDER_SITE_OTHER): Payer: BLUE CROSS/BLUE SHIELD | Admitting: Family Medicine

## 2017-11-02 VITALS — BP 123/74 | HR 74 | Temp 101.0°F | Ht 69.0 in | Wt 187.0 lb

## 2017-11-02 DIAGNOSIS — Z0000 Encounter for general adult medical examination without abnormal findings: Secondary | ICD-10-CM | POA: Diagnosis not present

## 2017-11-02 DIAGNOSIS — B079 Viral wart, unspecified: Secondary | ICD-10-CM

## 2017-11-02 DIAGNOSIS — E785 Hyperlipidemia, unspecified: Secondary | ICD-10-CM | POA: Diagnosis not present

## 2017-11-02 DIAGNOSIS — K219 Gastro-esophageal reflux disease without esophagitis: Secondary | ICD-10-CM

## 2017-11-02 DIAGNOSIS — D509 Iron deficiency anemia, unspecified: Secondary | ICD-10-CM

## 2017-11-02 DIAGNOSIS — N400 Enlarged prostate without lower urinary tract symptoms: Secondary | ICD-10-CM

## 2017-11-02 DIAGNOSIS — E559 Vitamin D deficiency, unspecified: Secondary | ICD-10-CM

## 2017-11-02 MED ORDER — METHOCARBAMOL 500 MG PO TABS: 500.0000 mg | ORAL_TABLET | Freq: Four times a day (QID) | ORAL | 5 refills | Status: DC | PRN

## 2017-11-02 MED ORDER — ROSUVASTATIN CALCIUM 10 MG PO TABS: 10.0000 mg | ORAL_TABLET | Freq: Every day | ORAL | 3 refills | Status: DC

## 2017-11-02 NOTE — Progress Notes (Addendum)
Subjective:    Patient ID: Joseph Oliver, male    DOB: 04/02/1961, 57 y.o.   MRN: [id number]  HPI Pt here for annual CPE and follow up on chronic medical problems. He is taking medications regularly.  Patient has no particular complaints today.  Unfortunately did lose his job after working for 34 years late last summer.  He has not started working anywhere else at this point in time.  He also is due a repeat colonoscopy and the late summer 2021 because of polyps that were found in 2016.  The patient is pleasant and doing well overall.  He is gotten over the shock of not having his job and he is looking more actively for a job moving forward especially the summer.  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  His mother died of Alzheimer's and lung cancer.  She was a smoker his father died of dementia and older age.  He has 1 sister and 2 brothers and he is the baby and only 1 of the siblings has thyroid problems.  The patient denies any headaches or blurred vision and he is in need of an eye exam.  He does regularly to [place].  He denies any chest pain or shortness of breath does not get a lot of exercise.  He says that since he has been out of his regular job that he had for 34 years he has had a lot less trouble with his neck and back and he is reduce the ranitidine from 150 to 75 mg twice daily.  He denies any trouble with nausea vomiting diarrhea blood in the stool or black tarry bowel movements.  He is passing his water without problems but it is slow at times to pass.   Patient Active Problem List   Diagnosis Date Noted  . Cervicalgia 07/17/2015  . Vitamin D deficiency 11/08/2014  . Generalized anxiety disorder 01/19/2013  . Iron deficiency anemia 11/14/2011  . Hyperlipidemia 11/14/2011  . GERD (gastroesophageal reflux disease) 11/14/2011   Outpatient Encounter Medications as of 11/02/2017  Medication Sig  .  calcium-vitamin D (OSCAL 500/200 D-3) 500-200 MG-UNIT per tablet Take 1 tablet by mouth daily.  . Ferrous Sulfate (IRON) 325 (65 FE) MG TABS Take 1 tablet by mouth daily.  . methocarbamol (ROBAXIN) 500 MG tablet TAKE 1 TABLET (500 MG TOTAL) BY MOUTH FOUR TIMES DAILY AS NEEDED FOR MUSCLE SPASMS.  . Multiple Vitamins-Minerals (CENTRUM SILVER PO) Take 1 capsule by mouth daily.  . ranitidine (ZANTAC) 150 MG tablet Take 150 mg by mouth 2 (two) times daily.  . rosuvastatin (CRESTOR) 10 MG tablet Take 1 tablet (10 mg total) by mouth at bedtime.  . tizanidine (ZANAFLEX) 4 MG capsule Take 1 capsule (4 mg total) by mouth 3 (three) times daily as needed for muscle spasms.  Marland Kitchen VITAMIN D, CHOLECALCIFEROL, PO Take by mouth as directed.  . [DISCONTINUED] hydrOXYzine (ATARAX/VISTARIL) 25 MG tablet Take 1 tablet (25 mg total) by mouth 3 (three) times daily as needed.  . [DISCONTINUED] traZODone (DESYREL) 50 MG tablet TAKE 1/2-1 TABLET (25-50 MG TOTAL) BY MOUTH AT BEDTIME AS NEEDED FOR SLEEP.   No facility-administered encounter medications on file as of 11/02/2017.       Review of Systems  Constitutional: Negative.   HENT: Negative.   Eyes: Negative.   Respiratory: Negative.   Cardiovascular: Negative.   Gastrointestinal: Negative.  Endocrine: Negative.   Genitourinary: Negative.   Musculoskeletal: Negative.   Skin: Negative.   Allergic/Immunologic: Negative.   Neurological: Negative.   Hematological: Negative.   Psychiatric/Behavioral: Negative.        Objective:   Physical Exam  Constitutional: He is oriented to person, place, and time. He appears well-developed and well-nourished. No distress.  The patient is pleasant and nondistressed as he was this past summer.  He plans to start searching for a job this summer.  HENT:  Head: Normocephalic and atraumatic.  Right Ear: External ear normal.  Left Ear: External ear normal.  Mouth/Throat: Oropharynx is clear and moist. No oropharyngeal  exudate.  Nasal turbinate congestion left greater than right  Eyes: Pupils are equal, round, and reactive to light. Conjunctivae and EOM are normal. Right eye exhibits no discharge. Left eye exhibits no discharge.  Neck: Normal range of motion. Neck supple. No tracheal deviation present. No thyromegaly present.  Cardiovascular: Normal rate, regular rhythm, normal heart sounds and intact distal pulses.  No murmur heard. The heart is regular at 60 to 72/min  Pulmonary/Chest: Effort normal and breath sounds normal. He has no wheezes. He has no rales. He exhibits no tenderness.  No axillary adenopathy and chest is clear anteriorly and posteriorly  Abdominal: Soft. Bowel sounds are normal. He exhibits no mass. There is no tenderness. There is no guarding.  No liver or spleen enlargement no epigastric tenderness no bruits and no inguinal adenopathy  Genitourinary: Rectum normal and penis normal.  Genitourinary Comments: The prostate is slightly enlarged but smooth.  There were no rectal masses and the external genitalia were within normal limits with no lumps or hernias being felt.  There was a small warty type growth and cryotherapy was performed on the penile shaft without problems.  Musculoskeletal: Normal range of motion. He exhibits no edema or deformity.  Lymphadenopathy:    He has no cervical adenopathy.  Neurological: He is alert and oriented to person, place, and time. He has normal reflexes. No cranial nerve deficit.  Skin: Skin is warm and dry. No rash noted.  Therapy on small warty type growth on penile shaft without problems.  Psychiatric: He has a normal mood and affect. His behavior is normal. Judgment and thought content normal.  Patient's mood affect and behavior were normal.  Nursing note and vitals reviewed.  BP 123/74 (BP Location: Left Arm)   Pulse 74   Temp (!) 101 F (38.3 C) (Oral)   Ht '5\' 9"'$  (1.753 m)   Wt 187 lb (84.8 kg)   BMI 27.62 kg/m         Assessment &  Plan:  1. Annual physical exam -Next colonoscopy is due in the late summer 2021. - CBC with Differential/Platelet; Future - BMP8+EGFR; Future - Lipid panel; Future - VITAMIN D 25 Hydroxy (Vit-D Deficiency, Fractures); Future - Hepatic function panel; Future - PSA, total and free; Future - Thyroid Panel With TSH; Future - Urinalysis, Complete - DG Chest 2 View; Future - EKG 12-Lead  2. Benign prostatic hyperplasia, unspecified whether lower urinary tract symptoms present -The prostate was enlarged but soft and smooth without lumps or masses and the patient does describe some occasional slow voiding. - CBC with Differential/Platelet; Future - PSA, total and free; Future - Urinalysis, Complete  3. Hyperlipidemia, unspecified hyperlipidemia type -Continue with current treatment pending results of lab work - CBC with Differential/Platelet; Future - BMP8+EGFR; Future - Lipid panel; Future - DG Chest 2 View; Future - EKG  12-Lead  4. Gastroesophageal reflux disease, esophagitis presence not specified -Continue with ranitidine 75 twice daily - CBC with Differential/Platelet; Future - Hepatic function panel; Future  5. Vitamin D deficiency -Continue with vitamin D replacement pending results of lab work - CBC with Differential/Platelet; Future - VITAMIN D 25 Hydroxy (Vit-D Deficiency, Fractures); Future  6. Iron deficiency anemia, unspecified iron deficiency anemia type -Check serum iron and ferritin levels - CBC with Differential/Platelet; Future  7. Viral warts, unspecified type -CRYO therapy one wart without problems, penis.  Meds ordered this encounter  Medications  . rosuvastatin (CRESTOR) 10 MG tablet    Sig: Take 1 tablet (10 mg total) by mouth at bedtime.    Dispense:  90 tablet    Refill:  3  . methocarbamol (ROBAXIN) 500 MG tablet    Sig: Take 1 tablet (500 mg total) by mouth every 6 (six) hours as needed for muscle spasms.    Dispense:  120 tablet    Refill:  5     Patient Instructions  Continue current medications. Continue good therapeutic lifestyle changes which include good diet and exercise. Fall precautions discussed with patient. If an FOBT was given today- please return it to our front desk. If you are over 55 years old - you may need Prevnar 55 or the adult Pneumonia vaccine.  **Flu shots are available--- please call and schedule a FLU-CLINIC appointment**  After your visit with [place] today you will receive a survey in the mail or online from [employer] regarding your care with [place]. Please take a moment to fill this out. Your feedback is very important to [place] as you can help [place] better understand your patient needs as well as improve your experience and satisfaction. WE CARE ABOUT YOU!!!   Continue to drink plenty of fluids and stay well-hydrated Drink water Avoid irritating foods that would make your reflux worse. Stay active physically Continue with low-cholesterol diet We will call with results of lab work chest x-ray and urinalysis results as soon as those results become available Cryotherapy was performed on a wart in the genital area and the patient tolerated the procedure well and he should get back in touch with [place] if this does not completely eliminate this warty type growth. Do not forget to get your eyes examined If nasal congestion continues and becomes bothersome try Flonase 1 to 2 sprays each nostril over-the-counter and if this works, call us and we will call a prescription and as a prescription will be cheaper than over-the-counter medicine.  Arrie Senate MD

## 2017-11-02 NOTE — Addendum Note (Signed)
Addended by: Zannie Cove on: 11/02/2017 03:42 PM   Modules accepted: Orders

## 2017-11-02 NOTE — Patient Instructions (Addendum)
Continue current medications. Continue good therapeutic lifestyle changes which include good diet and exercise. Fall precautions discussed with patient. If an FOBT was given today- please return it to our front desk. If you are over 58 years old - you may need Prevnar 48 or the adult Pneumonia vaccine.  **Flu shots are available--- please call and schedule a FLU-CLINIC appointment**  After your visit with [place] today you will receive a survey in the mail or online from [employer] regarding your care with [place]. Please take a moment to fill this out. Your feedback is very important to [place] as you can help [place] better understand your patient needs as well as improve your experience and satisfaction. WE CARE ABOUT YOU!!!   Continue to drink plenty of fluids and stay well-hydrated Drink water Avoid irritating foods that would make your reflux worse. Stay active physically Continue with low-cholesterol diet We will call with results of lab work chest x-ray and urinalysis results as soon as those results become available Cryotherapy was performed on a wart in the genital area and the patient tolerated the procedure well and he should get back in touch with [place] if this does not completely eliminate this warty type growth. Do not forget to get your eyes examined If nasal congestion continues and becomes bothersome try Flonase 1 to 2 sprays each nostril over-the-counter and if this works, call us and we will call a prescription and as a prescription will be cheaper than over-the-counter medicine.

## 2017-11-03 ENCOUNTER — Other Ambulatory Visit: Payer: BLUE CROSS/BLUE SHIELD

## 2017-11-03 DIAGNOSIS — K219 Gastro-esophageal reflux disease without esophagitis: Secondary | ICD-10-CM

## 2017-11-03 DIAGNOSIS — N400 Enlarged prostate without lower urinary tract symptoms: Secondary | ICD-10-CM

## 2017-11-03 DIAGNOSIS — Z0000 Encounter for general adult medical examination without abnormal findings: Secondary | ICD-10-CM

## 2017-11-03 DIAGNOSIS — E559 Vitamin D deficiency, unspecified: Secondary | ICD-10-CM | POA: Diagnosis not present

## 2017-11-03 DIAGNOSIS — E785 Hyperlipidemia, unspecified: Secondary | ICD-10-CM

## 2017-11-03 DIAGNOSIS — D509 Iron deficiency anemia, unspecified: Secondary | ICD-10-CM | POA: Diagnosis not present

## 2017-11-03 LAB — URINALYSIS, COMPLETE
BILIRUBIN UA: NEGATIVE
GLUCOSE, UA: NEGATIVE
Ketones, UA: NEGATIVE
Leukocytes, UA: NEGATIVE
NITRITE UA: NEGATIVE
PH UA: 6 (ref 5.0–7.5)
PROTEIN UA: NEGATIVE
RBC UA: NEGATIVE
Specific Gravity, UA: <1.005 — ABNORMAL LOW (ref 1.005–1.030)
UUROB: 0.2 mg/dL (ref 0.2–1.0)

## 2017-11-03 LAB — MICROSCOPIC EXAMINATION
BACTERIA UA: NONE SEEN
Epithelial Cells (non renal): NONE SEEN /hpf (ref 0–10)
RBC MICROSCOPIC, UA: NONE SEEN /HPF (ref 0–2)
RENAL EPITHEL UA: NONE SEEN /HPF
WBC, UA: NONE SEEN /hpf (ref 0–5)

## 2017-11-03 NOTE — Addendum Note (Signed)
Addended by: Pollyann Kennedy F on: 11/03/2017 12:25 PM   Modules accepted: Orders

## 2017-11-04 LAB — CBC WITH DIFFERENTIAL/PLATELET
Basophils Absolute: 0 10*3/uL (ref 0.0–0.2)
Basos: 0 %
EOS (ABSOLUTE): 0.2 10*3/uL (ref 0.0–0.4)
EOS: 2 %
HEMATOCRIT: 43.6 % (ref 37.5–51.0)
HEMOGLOBIN: 14.9 g/dL (ref 13.0–17.7)
IMMATURE GRANULOCYTES: 0 %
Immature Grans (Abs): 0 10*3/uL (ref 0.0–0.1)
Lymphocytes Absolute: 2.4 10*3/uL (ref 0.7–3.1)
Lymphs: 28 %
MCH: 30.3 pg (ref 26.6–33.0)
MCHC: 34.2 g/dL (ref 31.5–35.7)
MCV: 89 fL (ref 79–97)
MONOCYTES: 13 %
Monocytes Absolute: 1.1 10*3/uL — ABNORMAL HIGH (ref 0.1–0.9)
NEUTROS PCT: 57 %
Neutrophils Absolute: 4.7 10*3/uL (ref 1.4–7.0)
Platelets: 277 10*3/uL (ref 150–450)
RBC: 4.92 x10E6/uL (ref 4.14–5.80)
RDW: 13.8 % (ref 12.3–15.4)
WBC: 8.5 10*3/uL (ref 3.4–10.8)

## 2017-11-04 LAB — IRON AND TIBC
Iron Saturation: 20 % (ref 15–55)
Iron: 53 ug/dL (ref 38–169)
TIBC: 260 ug/dL (ref 250–450)
UIBC: 207 ug/dL (ref 111–343)

## 2017-11-04 LAB — BMP8+EGFR
BUN/Creatinine Ratio: 15 (ref 9–20)
BUN: 15 mg/dL (ref 6–24)
CO2: 23 mmol/L (ref 20–29)
CREATININE: 1.01 mg/dL (ref 0.76–1.27)
Calcium: 9.2 mg/dL (ref 8.7–10.2)
Chloride: 103 mmol/L (ref 96–106)
GFR calc Af Amer: 95 mL/min/{1.73_m2} (ref >59)
GFR calc non Af Amer: 82 mL/min/{1.73_m2} (ref >59)
GLUCOSE: 90 mg/dL (ref 65–99)
Potassium: 4.2 mmol/L (ref 3.5–5.2)
Sodium: 141 mmol/L (ref 134–144)

## 2017-11-04 LAB — LIPID PANEL
Chol/HDL Ratio: 2.2 ratio (ref 0.0–5.0)
Cholesterol, Total: 130 mg/dL (ref 100–199)
HDL: 60 mg/dL (ref >39)
LDL Calculated: 53 mg/dL (ref 0–99)
Triglycerides: 84 mg/dL (ref 0–149)
VLDL Cholesterol Cal: 17 mg/dL (ref 5–40)

## 2017-11-04 LAB — THYROID PANEL WITH TSH
Free Thyroxine Index: 1.7 (ref 1.2–4.9)
T3 Uptake Ratio: 27 % (ref 24–39)
T4, Total: 6.3 ug/dL (ref 4.5–12.0)
TSH: 1.17 u[IU]/mL (ref 0.450–4.500)

## 2017-11-04 LAB — VITAMIN D 25 HYDROXY (VIT D DEFICIENCY, FRACTURES): VIT D 25 HYDROXY: 46.7 ng/mL (ref 30.0–100.0)

## 2017-11-04 LAB — HEPATIC FUNCTION PANEL
ALK PHOS: 62 IU/L (ref 39–117)
ALT: 20 IU/L (ref 0–44)
AST: 21 IU/L (ref 0–40)
Albumin: 4.2 g/dL (ref 3.5–5.5)
BILIRUBIN, DIRECT: 0.13 mg/dL (ref 0.00–0.40)
Bilirubin Total: 0.4 mg/dL (ref 0.0–1.2)
Total Protein: 7 g/dL (ref 6.0–8.5)

## 2017-11-04 LAB — PSA, TOTAL AND FREE
PROSTATE SPECIFIC AG, SERUM: 0.2 ng/mL (ref 0.0–4.0)
PSA FREE PCT: 35 %
PSA, Free: 0.07 ng/mL

## 2017-11-04 LAB — FERRITIN: FERRITIN: 483 ng/mL — AB (ref 30–400)

## 2017-12-29 DIAGNOSIS — H40023 Open angle with borderline findings, high risk, bilateral: Secondary | ICD-10-CM | POA: Diagnosis not present

## 2017-12-29 DIAGNOSIS — H40053 Ocular hypertension, bilateral: Secondary | ICD-10-CM | POA: Diagnosis not present

## 2018-02-09 DIAGNOSIS — H40013 Open angle with borderline findings, low risk, bilateral: Secondary | ICD-10-CM | POA: Diagnosis not present

## 2018-02-09 DIAGNOSIS — H40053 Ocular hypertension, bilateral: Secondary | ICD-10-CM | POA: Diagnosis not present

## 2018-02-09 DIAGNOSIS — Z83511 Family history of glaucoma: Secondary | ICD-10-CM | POA: Diagnosis not present

## 2018-06-30 ENCOUNTER — Ambulatory Visit (INDEPENDENT_AMBULATORY_CARE_PROVIDER_SITE_OTHER): Payer: BLUE CROSS/BLUE SHIELD | Admitting: Family Medicine

## 2018-06-30 VITALS — BP 138/89 | HR 74 | Temp 98.1°F | Ht 69.0 in | Wt 193.0 lb

## 2018-06-30 DIAGNOSIS — Z23 Encounter for immunization: Secondary | ICD-10-CM

## 2018-06-30 DIAGNOSIS — M542 Cervicalgia: Secondary | ICD-10-CM | POA: Diagnosis not present

## 2018-06-30 DIAGNOSIS — F411 Generalized anxiety disorder: Secondary | ICD-10-CM

## 2018-06-30 DIAGNOSIS — G8929 Other chronic pain: Secondary | ICD-10-CM

## 2018-06-30 DIAGNOSIS — F329 Major depressive disorder, single episode, unspecified: Secondary | ICD-10-CM

## 2018-06-30 MED ORDER — ALPRAZOLAM 0.5 MG PO TABS: 0.5000 mg | ORAL_TABLET | Freq: Every day | ORAL | 0 refills | Status: DC | PRN

## 2018-06-30 MED ORDER — ESCITALOPRAM OXALATE 10 MG PO TABS: 10.0000 mg | ORAL_TABLET | Freq: Every day | ORAL | 1 refills | Status: DC

## 2018-06-30 NOTE — Patient Instructions (Signed)
  We discussed the risks of benzodiazepine use.  We are starting generic Lexapro today.  Follow up in 8 weeks for recheck.  Taking the medicine as directed and not missing any doses is one of the best things you can do to treat your anxiety/ depression.  Here are some things to keep in mind:  1) Side effects (stomach upset, some increased anxiety) may happen before you notice a benefit.  These side effects typically go away over time. 2) Changes to your dose of medicine or a change in medication all together is sometimes necessary 3) Most people need to be on medication at least 12 months 4) Many people will notice an improvement within two weeks but the full effect of the medication can take up to 4-6 weeks 5) Stopping the medication when you start feeling better often results in a return of symptoms 6) Never discontinue your medication without contacting a health care professional first.  Some medications require gradual discontinuation/ taper and can make you sick if you stop them abruptly.  If your symptoms worsen or you have thoughts of suicide/homicide, PLEASE SEEK IMMEDIATE MEDICAL ATTENTION.  You may always call:  National Suicide Hotline: (445)221-0753 [place]: 512-032-5248 Crisis Recovery in [place]: 678-363-3374   These are available 24 hours a day, 7 days a week.

## 2018-06-30 NOTE — Progress Notes (Signed)
Subjective: CC: headaches PCP: Chipper Herb, MD CBS:[id number] P Joseph Oliver is a 58 y.o. male presenting to clinic today for:  1. Headaches Patient is actually here to discuss chronic neck pain and tension state.  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  This is causing increased neck spasm and pain.  He goes on to report that he also has some depressive symptoms that are described as sleeping longer than his usual and having low motivation to complete tasks.  He notes this is not consistent with his typical personality.  His past medical history is significant for cervical fusion in the past.  He is prescribed methocarbamol and Zanaflex for this.  He does not find that the Zanaflex is especially helpful.  The methocarbamol is not providing sufficient relief per his report as of late.  He states that he is to travel to [place] and during those visits he sometimes would get alprazolam to help with flights home.  During those days he noted that the neck tension seem to be very well controlled and he is wondering if this is something that could be prescribed today.  ROS: Per HPI  No Known Allergies Past Medical History:  Diagnosis Date  . Anemia    Hx of   . GERD (gastroesophageal reflux disease)   . Hyperlipemia   . Vitamin D deficiency     Current Outpatient Medications:  .  calcium-vitamin D (OSCAL 500/200 D-3) 500-200 MG-UNIT per tablet, Take 1 tablet by mouth daily., Disp: , Rfl:  .  Ferrous Sulfate (IRON) 325 (65 FE) MG TABS, Take 1 tablet by mouth daily., Disp: , Rfl:  .  methocarbamol (ROBAXIN) 500 MG tablet, Take 1 tablet (500 mg total) by mouth every 6 (six) hours as needed for muscle spasms., Disp: 120 tablet, Rfl: 5 .  Multiple Vitamins-Minerals (CENTRUM SILVER PO), Take 1 capsule by  mouth daily., Disp: , Rfl:  .  rosuvastatin (CRESTOR) 10 MG tablet, Take 1 tablet (10 mg total) by mouth at bedtime., Disp: 90 tablet, Rfl: 3 .  tizanidine (ZANAFLEX) 4 MG capsule, Take 1 capsule (4 mg total) by mouth 3 (three) times daily as needed for muscle spasms., Disp: 90 capsule, Rfl: 2 .  VITAMIN D, CHOLECALCIFEROL, PO, Take by mouth as directed., Disp: , Rfl:  Social History   Socioeconomic History  . Marital status: Divorced    Spouse name: Not on file  . Number of children: 0  . Years of education: Not on file  . Highest education level: Not on file  Occupational History  . Occupation: Product manager  . Financial resource strain: Not on file  . Food insecurity:    Worry: Not on file    Inability: Not on file  . Transportation needs:    Medical: Not on file    Non-medical: Not on file  Tobacco Use  . Smoking status: Never Smoker  . Smokeless tobacco: Never Used  Substance and Sexual Activity  . Alcohol use: Yes    Comment: occasional   . Drug use: No  . Sexual activity: Not on file  Lifestyle  . Physical activity:    Days per week: Not on file    Minutes per session: Not on file  . Stress: Not on  file  Relationships  . Social connections:    Talks on phone: Not on file    Gets together: Not on file    Attends religious service: Not on file    Active member of club or organization: Not on file    Attends meetings of clubs or organizations: Not on file    Relationship status: Not on file  . Intimate partner violence:    Fear of current or ex partner: Not on file    Emotionally abused: Not on file    Physically abused: Not on file    Forced sexual activity: Not on file  Other Topics Concern  . Not on file  Social History Narrative  . Not on file   Family History  Problem Relation Age of Onset  . Drug abuse Mother   . Cancer Mother        lung  . Alzheimer's disease Mother   . Colon cancer Neg Hx   . Rectal cancer Neg Hx   . Stomach cancer  Neg Hx     Objective: Office vital signs reviewed. BP 138/89   Pulse 74   Temp 98.1 F (36.7 C) (Oral)   Ht 5\' 9"  (1.753 m)   Wt 193 lb (87.5 kg)   BMI 28.50 kg/m   Physical Examination:  General: Awake, alert, well nourished, No acute distress Cardio: regular rate and rhythm, S1S2 heard, no murmurs appreciated Pulm: clear to auscultation bilaterally, no wheezes, rhonchi or rales; normal work of breathing on room air Extremities: warm, well perfused MSK: normal gait and station  Cervical spine: Patient has fairly good range of motion in flexion and rotation but has quite a bit of loss of active range of motion in extension of the spine.  He has no midline tenderness to palpation.  He has mild increased tonicity of the paraspinal muscles. Psych: Mood stable, speech normal affect appropriate.  Does not appear to be responding to internal stimuli.  Depression screen [facility] 2/9 06/30/2018 11/02/2017 01/16/2017  Decreased Interest 2 0 0  Down, Depressed, Hopeless 2 0 0  PHQ - 2 Score 4 0 0  Altered sleeping 2 - -  Tired, decreased energy 2 - -  Change in appetite 0 - -  Feeling bad or failure about yourself  0 - -  Trouble concentrating 0 - -  Moving slowly or fidgety/restless 0 - -  Suicidal thoughts 0 - -  PHQ-9 Score 8 - -  Difficult doing work/chores Not difficult at all - -   GAD 7 : Generalized Anxiety Score 06/30/2018  Nervous, Anxious, on Edge 2  Control/stop worrying 3  Worry too much - different things 3  Trouble relaxing 3  Restless 0  Easily annoyed or irritable 1  Afraid - awful might happen 0  Total GAD 7 Score 12   Assessment/ Plan: 57 y.o. male   1. Generalized anxiety disorder I reviewed his last metabolic panel from 2/58/5277 which demonstrated normal kidney function and liver function.  Gad 7 score of 12 with PHQ 9 score of 8.  Symptoms may be situational.  We had a long discussion about benzodiazepine use and the ultimate development of tolerance and need for  increased doses and frequencies.  We discussed that this would not be the best medication for ongoing treatment of his symptoms.  Given chronicity, we have discussed consideration for initiation of SSRI class of medication.  Lexapro 10 mg daily was started today.  He was given a small  quantity of alprazolam to have on hand while he transitions over to the SSRI.  However, I would recommend that if he has an ongoing need for the benzodiazepine that he continue to follow-up with his primary care doctor to have this refilled.  We discussed avoidance of alcohol, driving and operation of heavy machinery while taking benzodiazepine.  We discussed the possible side effects of Lexapro.  We discussed that he may need increased dose at our next visit if he is not getting sufficient relief of symptoms.  He was given a handout with the national suicide hotline, [place] crisis hotline and [facility] crisis hotline should he need them.  He will follow-up in the next 6 to 8 weeks for recheck. - escitalopram (LEXAPRO) 10 MG tablet; Take 1 tablet (10 mg total) by mouth daily.  Dispense: 30 tablet; Refill: 1 - ALPRAZolam (XANAX) 0.5 MG tablet; Take 1 tablet (0.5 mg total) by mouth daily as needed for anxiety.  Dispense: 30 tablet; Refill: 0  2. Reactive depression As above - escitalopram (LEXAPRO) 10 MG tablet; Take 1 tablet (10 mg total) by mouth daily.  Dispense: 30 tablet; Refill: 1 - ALPRAZolam (XANAX) 0.5 MG tablet; Take 1 tablet (0.5 mg total) by mouth daily as needed for anxiety.  Dispense: 30 tablet; Refill: 0  3. Chronic neck pain Again, we discussed that benzodiazepine would not be the ideal treatment for tension in the neck alone.  I would prefer that he rely on his prescribed medications by his neck surgeon but we will try to alleviate anxiety and depressive symptoms that may be increasing muscle spasm as above.   Janora Norlander, DO [place] [phone]

## 2018-07-26 ENCOUNTER — Other Ambulatory Visit: Payer: Self-pay | Admitting: Family Medicine

## 2018-07-26 DIAGNOSIS — F329 Major depressive disorder, single episode, unspecified: Secondary | ICD-10-CM

## 2018-07-26 DIAGNOSIS — F411 Generalized anxiety disorder: Secondary | ICD-10-CM

## 2018-08-20 DIAGNOSIS — H40013 Open angle with borderline findings, low risk, bilateral: Secondary | ICD-10-CM | POA: Diagnosis not present

## 2018-08-23 ENCOUNTER — Other Ambulatory Visit: Payer: Self-pay | Admitting: Family Medicine

## 2018-08-23 DIAGNOSIS — F411 Generalized anxiety disorder: Secondary | ICD-10-CM

## 2018-08-23 DIAGNOSIS — F329 Major depressive disorder, single episode, unspecified: Secondary | ICD-10-CM

## 2018-08-25 ENCOUNTER — Encounter: Payer: Self-pay | Admitting: Family Medicine

## 2018-08-25 ENCOUNTER — Ambulatory Visit (INDEPENDENT_AMBULATORY_CARE_PROVIDER_SITE_OTHER): Payer: BLUE CROSS/BLUE SHIELD | Admitting: Family Medicine

## 2018-08-25 ENCOUNTER — Other Ambulatory Visit: Payer: Self-pay

## 2018-08-25 VITALS — BP 120/76 | HR 68 | Temp 99.0°F | Ht 69.0 in | Wt 197.6 lb

## 2018-08-25 DIAGNOSIS — F329 Major depressive disorder, single episode, unspecified: Secondary | ICD-10-CM | POA: Diagnosis not present

## 2018-08-25 DIAGNOSIS — F411 Generalized anxiety disorder: Secondary | ICD-10-CM | POA: Diagnosis not present

## 2018-08-25 MED ORDER — ESCITALOPRAM OXALATE 10 MG PO TABS: 10.0000 mg | ORAL_TABLET | Freq: Every day | ORAL | 2 refills | Status: DC

## 2018-08-25 NOTE — Progress Notes (Signed)
Subjective: CC: f/u anxiety/ tension headache PCP: Chipper Herb, MD TDS:[id number] Joseph Oliver is a 58 y.o. male presenting to clinic today for:  1. Anxiety/ depression Patient last seen in January for generalized anxiety disorder, reactive depression.  He was started on Lexapro 10 mg daily and given a small quantity of Xanax to have while he titrated on to SSRI.  He notes that the Lexapro has helped quite a bit with his anxiety symptoms and depressive symptoms.  He continues to have some stressors but feels that he is more easily able to deal with them.  He has not required Xanax in greater than 1 week now.  He continues to have some neck pains but often attributes this to overhead movements and extension of the neck.  No GI symptoms.  ROS: Per HPI  No Known Allergies Past Medical History:  Diagnosis Date  . Anemia    Hx of   . GERD (gastroesophageal reflux disease)   . Hyperlipemia   . Vitamin D deficiency     Current Outpatient Medications:  .  ALPRAZolam (XANAX) 0.5 MG tablet, Take 1 tablet (0.5 mg total) by mouth daily as needed for anxiety., Disp: 30 tablet, Rfl: 0 .  calcium-vitamin D (OSCAL 500/200 D-3) 500-200 MG-UNIT per tablet, Take 1 tablet by mouth daily., Disp: , Rfl:  .  escitalopram (LEXAPRO) 10 MG tablet, TAKE 1 TABLET BY MOUTH EVERY DAY, Disp: 90 tablet, Rfl: 0 .  Ferrous Sulfate (IRON) 325 (65 FE) MG TABS, Take 1 tablet by mouth daily., Disp: , Rfl:  .  methocarbamol (ROBAXIN) 500 MG tablet, Take 1 tablet (500 mg total) by mouth every 6 (six) hours as needed for muscle spasms., Disp: 120 tablet, Rfl: 5 .  Multiple Vitamins-Minerals (CENTRUM SILVER PO), Take 1 capsule by mouth daily., Disp: , Rfl:  .  rosuvastatin (CRESTOR) 10 MG tablet, Take 1 tablet (10 mg total) by mouth at bedtime., Disp: 90 tablet, Rfl: 3 .  tizanidine (ZANAFLEX) 4 MG capsule, Take 1 capsule (4 mg total) by mouth 3 (three) times daily as needed for muscle spasms., Disp: 90 capsule, Rfl: 2 .   VITAMIN D, CHOLECALCIFEROL, PO, Take by mouth as directed., Disp: , Rfl:  Social History   Socioeconomic History  . Marital status: Divorced    Spouse name: Not on file  . Number of children: 0  . Years of education: Not on file  . Highest education level: Not on file  Occupational History  . Occupation: Product manager  . Financial resource strain: Not on file  . Food insecurity:    Worry: Not on file    Inability: Not on file  . Transportation needs:    Medical: Not on file    Non-medical: Not on file  Tobacco Use  . Smoking status: Never Smoker  . Smokeless tobacco: Never Used  Substance and Sexual Activity  . Alcohol use: Yes    Comment: occasional   . Drug use: No  . Sexual activity: Not on file  Lifestyle  . Physical activity:    Days per week: Not on file    Minutes per session: Not on file  . Stress: Not on file  Relationships  . Social connections:    Talks on phone: Not on file    Gets together: Not on file    Attends religious service: Not on file    Active member of club or organization: Not on file    Attends meetings  of clubs or organizations: Not on file    Relationship status: Not on file  . Intimate partner violence:    Fear of current or ex partner: Not on file    Emotionally abused: Not on file    Physically abused: Not on file    Forced sexual activity: Not on file  Other Topics Concern  . Not on file  Social History Narrative  . Not on file   Family History  Problem Relation Age of Onset  . Drug abuse Mother   . Cancer Mother        lung  . Alzheimer's disease Mother   . Colon cancer Neg Hx   . Rectal cancer Neg Hx   . Stomach cancer Neg Hx     Objective: Office vital signs reviewed. BP 120/76   Pulse 68   Temp 99 F (37.2 C) (Oral)   Ht 5\' 9"  (1.753 m)   Wt 197 lb 9.6 oz (89.6 kg)   BMI 29.18 kg/m   Physical Examination:  General: Awake, alert, well nourished, No acute distress Cardio: regular rate and rhythm,  S1S2 heard, no murmurs appreciated Pulm: clear to auscultation bilaterally, no wheezes, rhonchi or rales; normal work of breathing on room air Psych: Mood stable, speech normal affect appropriate.  Does not appear to be responding to internal stimuli. Pleasant and interactive.  Depression screen [facility] 2/9 08/25/2018 06/30/2018 11/02/2017  Decreased Interest 2 2 0  Down, Depressed, Hopeless 2 2 0  PHQ - 2 Score 4 4 0  Altered sleeping 0 2 -  Tired, decreased energy 2 2 -  Change in appetite 2 0 -  Feeling bad or failure about yourself  0 0 -  Trouble concentrating 0 0 -  Moving slowly or fidgety/restless 0 0 -  Suicidal thoughts 0 0 -  PHQ-9 Score 8 8 -  Difficult doing work/chores Somewhat difficult Not difficult at all -   GAD 7 : Generalized Anxiety Score 06/30/2018  Nervous, Anxious, on Edge 2  Control/stop worrying 3  Worry too much - different things 3  Trouble relaxing 3  Restless 0  Easily annoyed or irritable 1  Afraid - awful might happen 0  Total GAD 7 Score 12   Assessment/ Plan: 58 y.o. male   1. Generalized anxiety disorder Subjectively improving.  His PHQ 9 has remained stable but he is feeling better.  I have refilled the Lexapro.  He did not want to increase the dose at this time.  He continues to have about 15 tablets of the Xanax left at home if needed.  We discussed that if he finds a need for this medication going forward, we should reconsider increasing dose of Lexapro.  He will follow-up with his primary care doctor for ongoing medical needs. - escitalopram (LEXAPRO) 10 MG tablet; Take 1 tablet (10 mg total) by mouth daily.  Dispense: 90 tablet; Refill: 2  2. Reactive depression - escitalopram (LEXAPRO) 10 MG tablet; Take 1 tablet (10 mg total) by mouth daily.  Dispense: 90 tablet; Refill: [place], [place] [phone]

## 2018-11-10 ENCOUNTER — Ambulatory Visit: Payer: BLUE CROSS/BLUE SHIELD | Admitting: Family Medicine

## 2018-11-23 ENCOUNTER — Other Ambulatory Visit: Payer: Self-pay

## 2018-11-24 ENCOUNTER — Ambulatory Visit (INDEPENDENT_AMBULATORY_CARE_PROVIDER_SITE_OTHER): Payer: BC Managed Care – PPO | Admitting: Family Medicine

## 2018-11-24 ENCOUNTER — Encounter: Payer: Self-pay | Admitting: Family Medicine

## 2018-11-24 VITALS — BP 129/80 | HR 65 | Temp 99.0°F | Ht 69.0 in | Wt 182.0 lb

## 2018-11-24 DIAGNOSIS — Z0001 Encounter for general adult medical examination with abnormal findings: Secondary | ICD-10-CM

## 2018-11-24 DIAGNOSIS — Z1159 Encounter for screening for other viral diseases: Secondary | ICD-10-CM

## 2018-11-24 DIAGNOSIS — Z1329 Encounter for screening for other suspected endocrine disorder: Secondary | ICD-10-CM

## 2018-11-24 DIAGNOSIS — E7849 Other hyperlipidemia: Secondary | ICD-10-CM

## 2018-11-24 DIAGNOSIS — Z114 Encounter for screening for human immunodeficiency virus [HIV]: Secondary | ICD-10-CM | POA: Diagnosis not present

## 2018-11-24 DIAGNOSIS — F329 Major depressive disorder, single episode, unspecified: Secondary | ICD-10-CM | POA: Diagnosis not present

## 2018-11-24 DIAGNOSIS — E559 Vitamin D deficiency, unspecified: Secondary | ICD-10-CM | POA: Diagnosis not present

## 2018-11-24 DIAGNOSIS — Z0000 Encounter for general adult medical examination without abnormal findings: Secondary | ICD-10-CM

## 2018-11-24 DIAGNOSIS — D508 Other iron deficiency anemias: Secondary | ICD-10-CM

## 2018-11-24 DIAGNOSIS — F411 Generalized anxiety disorder: Secondary | ICD-10-CM

## 2018-11-24 DIAGNOSIS — Z125 Encounter for screening for malignant neoplasm of prostate: Secondary | ICD-10-CM

## 2018-11-24 LAB — LIPID PANEL

## 2018-11-24 MED ORDER — METHOCARBAMOL 500 MG PO TABS: 500.0000 mg | ORAL_TABLET | Freq: Four times a day (QID) | ORAL | 5 refills | Status: DC | PRN

## 2018-11-24 MED ORDER — ROSUVASTATIN CALCIUM 10 MG PO TABS: 10.0000 mg | ORAL_TABLET | Freq: Every day | ORAL | 3 refills | Status: DC

## 2018-11-24 MED ORDER — ALPRAZOLAM 0.5 MG PO TABS: 0.5000 mg | ORAL_TABLET | Freq: Every day | ORAL | 0 refills | Status: DC | PRN

## 2018-11-24 NOTE — Progress Notes (Signed)
Joseph Oliver is a 58 y.o. male presents to office today for annual physical exam examination.    Concerns today include: 1. none  Marital status: has girlfriend, Substance use: rare ETOH (1-2 beers per week) Diet: balanced, Exercise: stays active Last colonoscopy: UTD Refills needed today: muscle relaxer (uses 2 per day). Neck pain well controlled. Doing well on Lexapro, needs refill of prn xanax (rare use) Immunizations needed: UTD  Past Medical History:  Diagnosis Date  . Anemia    Hx of   . GERD (gastroesophageal reflux disease)   . Hyperlipemia   . Vitamin D deficiency    Social History   Socioeconomic History  . Marital status: Divorced    Spouse name: Not on file  . Number of children: 0  . Years of education: Not on file  . Highest education level: Not on file  Occupational History  . Occupation: Product manager  . Financial resource strain: Not on file  . Food insecurity:    Worry: Not on file    Inability: Not on file  . Transportation needs:    Medical: Not on file    Non-medical: Not on file  Tobacco Use  . Smoking status: Never Smoker  . Smokeless tobacco: Never Used  Substance and Sexual Activity  . Alcohol use: Yes    Comment: occasional   . Drug use: No  . Sexual activity: Not on file  Lifestyle  . Physical activity:    Days per week: Not on file    Minutes per session: Not on file  . Stress: Not on file  Relationships  . Social connections:    Talks on phone: Not on file    Gets together: Not on file    Attends religious service: Not on file    Active member of club or organization: Not on file    Attends meetings of clubs or organizations: Not on file    Relationship status: Not on file  . Intimate partner violence:    Fear of current or ex partner: Not on file    Emotionally abused: Not on file    Physically abused: Not on file    Forced sexual activity: Not on file  Other Topics Concern  . Not on file  Social  History Narrative  . Not on file   Past Surgical History:  Procedure Laterality Date  . HAND SURGERY     Left   . NECK SURGERY    . RHINOPLASTY    . SPINAL FUSION     Family History  Problem Relation Age of Onset  . Drug abuse Mother   . Cancer Mother        lung  . Alzheimer's disease Mother   . Colon cancer Neg Hx   . Rectal cancer Neg Hx   . Stomach cancer Neg Hx     Current Outpatient Medications:  .  ALPRAZolam (XANAX) 0.5 MG tablet, Take 1 tablet (0.5 mg total) by mouth daily as needed for anxiety. (Patient not taking: Reported on 08/25/2018), Disp: 30 tablet, Rfl: 0 .  calcium-vitamin D (OSCAL 500/200 D-3) 500-200 MG-UNIT per tablet, Take 1 tablet by mouth daily., Disp: , Rfl:  .  escitalopram (LEXAPRO) 10 MG tablet, Take 1 tablet (10 mg total) by mouth daily., Disp: 90 tablet, Rfl: 2 .  Ferrous Sulfate (IRON) 325 (65 FE) MG TABS, Take 1 tablet by mouth daily., Disp: , Rfl:  .  methocarbamol (ROBAXIN) 500 MG  tablet, Take 1 tablet (500 mg total) by mouth every 6 (six) hours as needed for muscle spasms., Disp: 120 tablet, Rfl: 5 .  Multiple Vitamins-Minerals (CENTRUM SILVER PO), Take 1 capsule by mouth daily., Disp: , Rfl:  .  rosuvastatin (CRESTOR) 10 MG tablet, Take 1 tablet (10 mg total) by mouth at bedtime., Disp: 90 tablet, Rfl: 3 .  tizanidine (ZANAFLEX) 4 MG capsule, Take 1 capsule (4 mg total) by mouth 3 (three) times daily as needed for muscle spasms. (Patient not taking: Reported on 08/25/2018), Disp: 90 capsule, Rfl: 2 .  VITAMIN D, CHOLECALCIFEROL, PO, Take by mouth as directed., Disp: , Rfl:   No Known Allergies   ROS: Review of Systems Constitutional: negative Eyes: negative Ears, nose, mouth, throat, and face: negative Respiratory: negative Cardiovascular: negative Gastrointestinal: negative Genitourinary:negative Integument/breast: negative Hematologic/lymphatic: negative Musculoskeletal:negative Neurological: negative Behavioral/Psych: negative  Endocrine: negative Allergic/Immunologic: negative    Physical exam BP 129/80   Pulse 65   Temp 99 F (37.2 C) (Oral)   Ht 5' 9" (1.753 m)   Wt 182 lb (82.6 kg)   BMI 26.88 kg/m  General appearance: alert, cooperative, appears stated age and no distress Head: Normocephalic, without obvious abnormality, atraumatic Eyes: negative findings: lids and lashes normal, conjunctivae and sclerae normal, corneas clear and pupils equal, round, reactive to light and accomodation Ears: normal TM's and external ear canals both ears Nose: Nares normal. Septum midline. Mucosa normal. No drainage or sinus tenderness. Throat: lips, mucosa, and tongue normal; teeth and gums normal Neck: no adenopathy, no carotid bruit, no JVD, supple, symmetrical, trachea midline and no palpable thyroid masses/ nodules Back: symmetric, no curvature. ROM normal. No CVA tenderness. Lungs: clear to auscultation bilaterally Chest wall: no tenderness Heart: regular rate and rhythm, S1, S2 normal, no murmur, click, rub or gallop Abdomen: soft, non-tender; bowel sounds normal; no masses,  no organomegaly Extremities: extremities normal, atraumatic, no cyanosis or edema Pulses: 2+ and symmetric Skin: Skin color, texture, turgor normal. No rashes or lesions or Several pigmented nevi along the upper back noted.  No suspicious lesions. Lymph nodes: Cervical, supraclavicular, and axillary nodes normal. Neurologic: Alert and oriented X 3, normal strength and tone. Normal symmetric reflexes. Normal coordination and gait Psych: Mood stable, speech normal, affect appropriate, pleasant and interactive.  Does not appear to be responding to internal stimuli  Depression screen Joseph Oliver 2/9 08/25/2018 06/30/2018 11/02/2017  Decreased Interest 2 2 0  Down, Depressed, Hopeless 2 2 0  PHQ - 2 Score 4 4 0  Altered sleeping 0 2 -  Tired, decreased energy 2 2 -  Change in appetite 2 0 -  Feeling bad or failure about yourself  0 0 -  Trouble  concentrating 0 0 -  Moving slowly or fidgety/restless 0 0 -  Suicidal thoughts 0 0 -  PHQ-9 Score 8 8 -  Difficult doing work/chores Somewhat difficult Not difficult at all -   Assessment/ Plan: Joseph Oliver here for annual physical exam.   1. Annual physical exam Up-to-date on preventive care.  Have added HIV and hepatitis C screening as recommended by the CDC.  No known exposures or risk for either these infections.  2. Other hyperlipidemia Patient is fasting. - CMP14+EGFR - TSH - Lipid Panel  3. Other iron deficiency anemia - CBC  4. Reactive depression Under excellent control.  Continue Lexapro.  PRN use of Xanax given.  Last fill was in January.  Patient using sparingly and very responsibly. - ALPRAZolam Duanne Moron)  0.5 MG tablet; Take 1 tablet (0.5 mg total) by mouth daily as needed for anxiety.  Dispense: 30 tablet; Refill: 0  5. Vitamin D deficiency - VITAMIN D 25 Hydroxy (Vit-D Deficiency, Fractures)  6. Generalized anxiety disorder - ALPRAZolam (XANAX) 0.5 MG tablet; Take 1 tablet (0.5 mg total) by mouth daily as needed for anxiety.  Dispense: 30 tablet; Refill: 0  7. Screening for HIV (human immunodeficiency virus) - HIV antibody (with reflex)  8. Encounter for hepatitis C screening test for low risk patient - Hepatitis C antibody  9. Screening for malignant neoplasm of prostate No symptoms - PSA  10. Screening for thyroid disorder - TSH   Handout provided on healthy lifestyle choices, including diet (rich in fruits, vegetables and lean meats and low in salt and simple carbohydrates) and exercise (at least 30 minutes of moderate physical activity daily).  Patient to follow up in 1 year for annual exam or sooner if needed.  Camiah Humm M. Lajuana Ripple, DO

## 2018-11-24 NOTE — Patient Instructions (Addendum)
You had labs performed today.  You will be contacted with the results of the labs once they are available, usually in the next 3 business days for routine lab work.   Preventive Care 40-64 Years, Male Preventive care refers to lifestyle choices and visits with your health care provider that can promote health and wellness. What does preventive care include?   A yearly physical exam. This is also called an annual well check.  Dental exams once or twice a year.  Routine eye exams. Ask your health care provider how often you should have your eyes checked.  Personal lifestyle choices, including: ? Daily care of your teeth and gums. ? Regular physical activity. ? Eating a healthy diet. ? Avoiding tobacco and drug use. ? Limiting alcohol use. ? Practicing safe sex. ? Taking low-dose aspirin every day starting at age 59. What happens during an annual well check? The services and screenings done by your health care provider during your annual well check will depend on your age, overall health, lifestyle risk factors, and family history of disease. Counseling Your health care provider may ask you questions about your:  Alcohol use.  Tobacco use.  Drug use.  Emotional well-being.  Home and relationship well-being.  Sexual activity.  Eating habits.  Work and work Statistician. Screening You may have the following tests or measurements:  Height, weight, and BMI.  Blood pressure.  Lipid and cholesterol levels. These may be checked every 5 years, or more frequently if you are over 50 years old.  Skin check.  Lung cancer screening. You may have this screening every year starting at age 26 if you have a 30-pack-year history of smoking and currently smoke or have quit within the past 15 years.  Colorectal cancer screening. All adults should have this screening starting at age 39 and continuing until age 47. Your health care provider may recommend screening at age 11. You will have  tests every 1-10 years, depending on your results and the type of screening test. People at increased risk should start screening at an earlier age. Screening tests may include: ? Guaiac-based fecal occult blood testing. ? Fecal immunochemical test (FIT). ? Stool DNA test. ? Virtual colonoscopy. ? Sigmoidoscopy. During this test, a flexible tube with a tiny camera (sigmoidoscope) is used to examine your rectum and lower colon. The sigmoidoscope is inserted through your anus into your rectum and lower colon. ? Colonoscopy. During this test, a long, thin, flexible tube with a tiny camera (colonoscope) is used to examine your entire colon and rectum.  Prostate cancer screening. Recommendations will vary depending on your family history and other risks.  Hepatitis C blood test.  Hepatitis B blood test.  Sexually transmitted disease (STD) testing.  Diabetes screening. This is done by checking your blood sugar (glucose) after you have not eaten for a while (fasting). You may have this done every 1-3 years. Discuss your test results, treatment options, and if necessary, the need for more tests with your health care provider. Vaccines Your health care provider may recommend certain vaccines, such as:  Influenza vaccine. This is recommended every year.  Tetanus, diphtheria, and acellular pertussis (Tdap, Td) vaccine. You may need a Td booster every 10 years.  Varicella vaccine. You may need this if you have not been vaccinated.  Zoster vaccine. You may need this after age 59.  Measles, mumps, and rubella (MMR) vaccine. You may need at least one dose of MMR if you were born in 1957 or  later. You may also need a second dose.  Pneumococcal 13-valent conjugate (PCV13) vaccine. You may need this if you have certain conditions and have not been vaccinated.  Pneumococcal polysaccharide (PPSV23) vaccine. You may need one or two doses if you smoke cigarettes or if you have certain conditions.   Meningococcal vaccine. You may need this if you have certain conditions.  Hepatitis A vaccine. You may need this if you have certain conditions or if you travel or work in places where you may be exposed to hepatitis A.  Hepatitis B vaccine. You may need this if you have certain conditions or if you travel or work in places where you may be exposed to hepatitis B.  Haemophilus influenzae type b (Hib) vaccine. You may need this if you have certain risk factors. Talk to your health care provider about which screenings and vaccines you need and how often you need them. This information is not intended to replace advice given to you by your health care provider. Make sure you discuss any questions you have with your health care provider. Document Released: 06/29/2015 Document Revised: 07/23/2017 Document Reviewed: 04/03/2015 Elsevier Interactive Patient Education  2019 [employer].

## 2018-11-25 ENCOUNTER — Telehealth: Payer: Self-pay | Admitting: Family Medicine

## 2018-11-25 LAB — CMP14+EGFR
ALT: 28 IU/L (ref 0–44)
AST: 31 IU/L (ref 0–40)
Albumin/Globulin Ratio: 2 (ref 1.2–2.2)
Albumin: 4.2 g/dL (ref 3.8–4.9)
Alkaline Phosphatase: 59 IU/L (ref 39–117)
BUN/Creatinine Ratio: 22 — ABNORMAL HIGH (ref 9–20)
BUN: 21 mg/dL (ref 6–24)
Bilirubin Total: 0.4 mg/dL (ref 0.0–1.2)
CO2: 21 mmol/L (ref 20–29)
Calcium: 8.8 mg/dL (ref 8.7–10.2)
Chloride: 104 mmol/L (ref 96–106)
Creatinine, Ser: 0.94 mg/dL (ref 0.76–1.27)
GFR calc Af Amer: 103 mL/min/{1.73_m2} (ref >59)
GFR calc non Af Amer: 89 mL/min/{1.73_m2} (ref >59)
Globulin, Total: 2.1 g/dL (ref 1.5–4.5)
Glucose: 100 mg/dL — ABNORMAL HIGH (ref 65–99)
Potassium: 4.2 mmol/L (ref 3.5–5.2)
Sodium: 140 mmol/L (ref 134–144)
Total Protein: 6.3 g/dL (ref 6.0–8.5)

## 2018-11-25 LAB — LIPID PANEL
Chol/HDL Ratio: 1.9 ratio (ref 0.0–5.0)
Cholesterol, Total: 124 mg/dL (ref 100–199)
HDL: 66 mg/dL (ref >39)
LDL Calculated: 46 mg/dL (ref 0–99)
Triglycerides: 59 mg/dL (ref 0–149)
VLDL Cholesterol Cal: 12 mg/dL (ref 5–40)

## 2018-11-25 LAB — CBC
Hematocrit: 41.3 % (ref 37.5–51.0)
Hemoglobin: 14 g/dL (ref 13.0–17.7)
MCH: 29.9 pg (ref 26.6–33.0)
MCHC: 33.9 g/dL (ref 31.5–35.7)
MCV: 88 fL (ref 79–97)
Platelets: 261 10*3/uL (ref 150–450)
RBC: 4.68 x10E6/uL (ref 4.14–5.80)
RDW: 12.7 % (ref 11.6–15.4)
WBC: 7.4 10*3/uL (ref 3.4–10.8)

## 2018-11-25 LAB — VITAMIN D 25 HYDROXY (VIT D DEFICIENCY, FRACTURES): Vit D, 25-Hydroxy: 40.6 ng/mL (ref 30.0–100.0)

## 2018-11-25 LAB — PSA: Prostate Specific Ag, Serum: 0.2 ng/mL (ref 0.0–4.0)

## 2018-11-25 LAB — HEPATITIS C ANTIBODY: Hep C Virus Ab: <0.1 s/co ratio (ref 0.0–0.9)

## 2018-11-25 LAB — TSH: TSH: 0.93 u[IU]/mL (ref 0.450–4.500)

## 2018-11-25 LAB — HIV ANTIBODY (ROUTINE TESTING W REFLEX): HIV Screen 4th Generation wRfx: NONREACTIVE

## 2018-11-25 NOTE — Telephone Encounter (Signed)
Pt called and visit was discussed. Pt loved Dr Joseph Oliver, but was slightly disappointed that whole body check was not done like he has had with Dr Laurance Flatten for years.

## 2018-12-10 ENCOUNTER — Telehealth: Payer: Self-pay | Admitting: Family Medicine

## 2018-12-10 DIAGNOSIS — F411 Generalized anxiety disorder: Secondary | ICD-10-CM

## 2018-12-10 DIAGNOSIS — F329 Major depressive disorder, single episode, unspecified: Secondary | ICD-10-CM

## 2018-12-10 MED ORDER — ESCITALOPRAM OXALATE 10 MG PO TABS: 10.0000 mg | ORAL_TABLET | Freq: Every day | ORAL | 0 refills | Status: DC

## 2018-12-10 NOTE — Telephone Encounter (Signed)
Ok to send but he has refills through the end of the year.  He may just need to call for an override with insurance to get them to cover the refill

## 2018-12-10 NOTE — Telephone Encounter (Signed)
Ok to send another rx for Lexapro to the pharmacy?

## 2018-12-10 NOTE — Telephone Encounter (Signed)
Pt aware of MD feedback and voiced understanding. Rx sent to [facility].

## 2019-02-16 DIAGNOSIS — H40013 Open angle with borderline findings, low risk, bilateral: Secondary | ICD-10-CM | POA: Diagnosis not present

## 2019-06-02 ENCOUNTER — Telehealth: Payer: Self-pay | Admitting: Family Medicine

## 2019-06-02 ENCOUNTER — Other Ambulatory Visit: Payer: Self-pay | Admitting: Family Medicine

## 2019-06-02 DIAGNOSIS — F411 Generalized anxiety disorder: Secondary | ICD-10-CM

## 2019-06-02 DIAGNOSIS — F329 Major depressive disorder, single episode, unspecified: Secondary | ICD-10-CM

## 2019-06-02 NOTE — Telephone Encounter (Signed)
Can pt do a televisit / video visit, instead of coming into the office Please advise

## 2019-06-03 ENCOUNTER — Other Ambulatory Visit: Payer: Self-pay

## 2019-06-03 ENCOUNTER — Ambulatory Visit (INDEPENDENT_AMBULATORY_CARE_PROVIDER_SITE_OTHER): Payer: BC Managed Care – PPO | Admitting: Family Medicine

## 2019-06-03 DIAGNOSIS — F411 Generalized anxiety disorder: Secondary | ICD-10-CM | POA: Diagnosis not present

## 2019-06-03 DIAGNOSIS — F329 Major depressive disorder, single episode, unspecified: Secondary | ICD-10-CM

## 2019-06-03 MED ORDER — ALPRAZOLAM 0.5 MG PO TABS: 0.5000 mg | ORAL_TABLET | Freq: Every day | ORAL | 0 refills | Status: DC | PRN

## 2019-06-03 MED ORDER — ESCITALOPRAM OXALATE 10 MG PO TABS: 10.0000 mg | ORAL_TABLET | Freq: Every day | ORAL | 3 refills | Status: DC

## 2019-06-03 NOTE — Telephone Encounter (Signed)
Ok to make televisit if he has current UDS and controlled substance contract on file.

## 2019-06-03 NOTE — Progress Notes (Signed)
Telephone visit  Subjective: CC: Follow-up anxiety/tension PCP: Janora Norlander, DO [id number] Joseph Oliver is a 58 y.o. male calls for telephone consult today. Patient provides verbal consent for consult held via phone.  Due to COVID-19 pandemic this visit was conducted virtually. This visit type was conducted due to national recommendations for restrictions regarding the COVID-19 Pandemic (e.g. social distancing, sheltering in place) in an effort to limit this patient's exposure and mitigate transmission in our community. All issues noted in this document were discussed and addressed.  A physical exam was not performed with this format.   Location of patient: truck Location of provider: [facility] Others present for call: none  1.  Anxiety disorder/tension Patient reports compliance with Lexapro nightly.  He does report a recent flare of his severe neck pain such that he was having difficulty with normal activity.  He ultimately took a Xanax and several hours later neck pain resolved.  He continues to use the Xanax very sparingly and in fact the last refill has not been since June.  He still has 2 remaining tablets but wants to make sure that he does not run out of this medication as this is what he uses and severe episodes of tension.  Denies any excessive daytime sedation, memory changes, difficulty with breathing.  No falls.  Overall he states that he is feeling well.   ROS: Per HPI  No Known Allergies Past Medical History:  Diagnosis Date  . Anemia    Hx of   . GERD (gastroesophageal reflux disease)   . Hyperlipemia   . Vitamin D deficiency     Current Outpatient Medications:  .  ALPRAZolam (XANAX) 0.5 MG tablet, Take 1 tablet (0.5 mg total) by mouth daily as needed for anxiety., Disp: 30 tablet, Rfl: 0 .  calcium-vitamin D (OSCAL 500/200 D-3) 500-200 MG-UNIT per tablet, Take 1 tablet by mouth daily., Disp: , Rfl:  .  escitalopram (LEXAPRO) 10 MG tablet, Take 1 tablet (10 mg total)  by mouth daily., Disp: 90 tablet, Rfl: 0 .  Ferrous Sulfate (IRON) 325 (65 FE) MG TABS, Take 1 tablet by mouth daily., Disp: , Rfl:  .  methocarbamol (ROBAXIN) 500 MG tablet, Take 1 tablet (500 mg total) by mouth every 6 (six) hours as needed for muscle spasms., Disp: 120 tablet, Rfl: 5 .  Multiple Vitamins-Minerals (CENTRUM SILVER PO), Take 1 capsule by mouth daily., Disp: , Rfl:  .  rosuvastatin (CRESTOR) 10 MG tablet, Take 1 tablet (10 mg total) by mouth at bedtime., Disp: 90 tablet, Rfl: 3 .  VITAMIN D, CHOLECALCIFEROL, PO, Take by mouth as directed., Disp: , Rfl:   Assessment/ Plan: 58 y.o. male   1. Generalized anxiety disorder Stable.  Rare use of alprazolam.  I discussed with the patient and the policy in place by [facility] health with regards to urine drug screen and controlled substance agreement that is to be updated yearly.  Unfortunately, the urine sample he dropped off today was in a normal urine specimen cup and not a tox assure.  We will need to repeat this at her next visit he was good understanding of this.  He will follow-up in 6 months in accordance with federal/state regulations surrounding class IV controlled substances if renewal needed at that time. - escitalopram (LEXAPRO) 10 MG tablet; Take 1 tablet (10 mg total) by mouth daily.  Dispense: 90 tablet; Refill: 3 - ALPRAZolam (XANAX) 0.5 MG tablet; Take 1 tablet (0.5 mg total) by mouth daily as needed  for anxiety.  Dispense: 30 tablet; Refill: 0  2. Reactive depression Stable - escitalopram (LEXAPRO) 10 MG tablet; Take 1 tablet (10 mg total) by mouth daily.  Dispense: 90 tablet; Refill: 3 - ALPRAZolam (XANAX) 0.5 MG tablet; Take 1 tablet (0.5 mg total) by mouth daily as needed for anxiety.  Dispense: 30 tablet; Refill: 0  The Narcotic Database has been reviewed.  There were no red flags.    Start time: 12:08pm End time: 12:20pm  Total time spent on patient care (including telephone call/ virtual visit): 17 minutes  [place], [place] [phone]

## 2019-06-03 NOTE — Patient Instructions (Signed)
Controlled Substance Guidelines:  1. You cannot get an early refill, even it is lost.  2. You cannot get controlled medications from any other doctor, unless it is the emergency department and related to a new problem or injury.  3. You cannot use alcohol, marijuana, cocaine or any other recreational drugs while using this medication. This is very dangerous.  4. You are willing to have your urine drug tested at each visit.  5. You will not drive while using this medication, because that can put yourself and others in serious danger of an accident. 6. If any medication is stolen, then there must be a police report to verify it, or it cannot be refilled.  7. I will not prescribe these medications for longer than 3 months.  8. You must bring your pill bottle to each visit.  9. You must use the same pharmacy for all refills for the medication, unless you clear it with me beforehand.  10. You cannot share or sell this medication.   

## 2019-06-06 NOTE — Telephone Encounter (Signed)
Closing encounter, pt had visit on 06/03/19

## 2019-09-01 ENCOUNTER — Other Ambulatory Visit: Payer: Self-pay | Admitting: Family Medicine

## 2019-09-01 NOTE — Telephone Encounter (Signed)
[facility] covering provider Last office visit 06/03/2019 Last refill 11/24/2018, #120, 5 refills

## 2019-10-14 ENCOUNTER — Encounter: Payer: Self-pay | Admitting: Family

## 2019-10-14 ENCOUNTER — Ambulatory Visit (INDEPENDENT_AMBULATORY_CARE_PROVIDER_SITE_OTHER): Payer: Self-pay | Admitting: Family

## 2019-10-14 DIAGNOSIS — S40861A Insect bite (nonvenomous) of right upper arm, initial encounter: Secondary | ICD-10-CM

## 2019-10-14 DIAGNOSIS — B880 Other acariasis: Secondary | ICD-10-CM

## 2019-10-14 DIAGNOSIS — W57XXXA Bitten or stung by nonvenomous insect and other nonvenomous arthropods, initial encounter: Secondary | ICD-10-CM

## 2019-10-14 DIAGNOSIS — B8809 Other acariasis: Secondary | ICD-10-CM

## 2019-10-14 DIAGNOSIS — S80261A Insect bite (nonvenomous), right knee, initial encounter: Secondary | ICD-10-CM

## 2019-10-14 MED ORDER — TRIAMCINOLONE ACETONIDE 0.5 % EX OINT: 1.0000 "application " | TOPICAL_OINTMENT | Freq: Two times a day (BID) | CUTANEOUS | 0 refills | Status: DC

## 2019-10-14 MED ORDER — DOXYCYCLINE HYCLATE 100 MG PO TABS: 200.0000 mg | ORAL_TABLET | Freq: Once | ORAL | 0 refills | Status: AC

## 2019-10-14 NOTE — Progress Notes (Signed)
Virtual Visit via telephone Note Due to COVID-19 pandemic this visit was conducted virtually. This visit type was conducted due to national recommendations for restrictions regarding the COVID-19 Pandemic (e.g. social distancing, sheltering in place) in an effort to limit this patient's exposure and mitigate transmission in our community. All issues noted in this document were discussed and addressed.  A physical exam was not performed with this format.  I connected with Joseph Oliver on 10/14/19 at 1:42 pm by telephone and verified that I am speaking with the correct person using two identifiers. Joseph Oliver is currently located at home and no one is currently with him  during visit. The provider, [name], FNP is located in their office at time of visit.  I discussed the limitations, risks, security and privacy concerns of performing an evaluation and management service by telephone and the availability of in person appointments. I also discussed with the patient that there may be a patient responsible charge related to this service. The patient expressed understanding and agreed to proceed.   History and Present Illness:  HPI Pt calls the office today with a tick bite that he noticed two days. He had been outside in tall grass working and noticed several "bite marks" on lower leg, upper leg, and groin.   He pulled a tick right knee and right axilla. He is unsure how long it had been attached. Denies any fever, headache, joint pain, or new rash other than the bite marks.   Review of Systems  All other systems reviewed and are negative.    Observations/Objective: No SOB or distress noted   Assessment and Plan: Joseph Oliver comes in today with chief complaint of No chief complaint on file.   Diagnosis and orders addressed:  1. Tick bite of right knee, initial encounter -Pt to report any new fever, joint pain, or rash -Wear protective clothing while outside- Long  sleeves and long pants -Put insect repellent on all exposed skin and along clothing -Take a shower as soon as possible after being outside -Pt will come get labs in 2-3 weeks - doxycycline (VIBRA-TABS) 100 MG tablet; Take 2 tablets (200 mg total) by mouth once for 1 dose.  Dispense: 2 tablet; Refill: 0 - Rocky mtn spotted fvr abs pnl(IgG+IgM); Future - Lyme Ab/Western Blot Reflex; Future - Alpha-Gal Panel; Future  2. Tick bite of right axillary region, initial encounter - doxycycline (VIBRA-TABS) 100 MG tablet; Take 2 tablets (200 mg total) by mouth once for 1 dose.  Dispense: 2 tablet; Refill: 0 - Rocky mtn spotted fvr abs pnl(IgG+IgM); Future - Lyme Ab/Western Blot Reflex; Future - Alpha-Gal Panel; Future  3. Chigger bites Do not scratch  Warm compresses - triamcinolone ointment (KENALOG) 0.5 %; Apply 1 application topically 2 (two) times daily.  Dispense: 30 g; Refill: 0      I discussed the assessment and treatment plan with the patient. The patient was provided an opportunity to ask questions and all were answered. The patient agreed with the plan and demonstrated an understanding of the instructions.   The patient was advised to call back or seek an in-person evaluation if the symptoms worsen or if the condition fails to improve as anticipated.  The above assessment and management plan was discussed with the patient. The patient verbalized understanding of and has agreed to the management plan. Patient is aware to call the clinic if symptoms persist or worsen. Patient is aware when to return to the clinic  for a follow-up visit. Patient educated on when it is appropriate to go to the emergency department.   Time call ended:  1:58 pm   I provided 16 minutes of non-face-to-face time during this encounter.    [name], FNP

## 2019-11-09 ENCOUNTER — Other Ambulatory Visit: Payer: Self-pay | Admitting: Family Medicine

## 2019-12-09 ENCOUNTER — Other Ambulatory Visit: Payer: Self-pay | Admitting: Family Medicine

## 2019-12-12 NOTE — Telephone Encounter (Signed)
30 day supply given 11/08/19-no follow up scheduled ntbs for further refills

## 2019-12-13 MED ORDER — ROSUVASTATIN CALCIUM 10 MG PO TABS: 10.0000 mg | ORAL_TABLET | Freq: Every day | ORAL | 0 refills | Status: DC

## 2019-12-13 NOTE — Telephone Encounter (Signed)
Appointment scheduled for physical with Dr. Lajuana Ripple on 01/04/2020.  Refill sent to [facility].

## 2020-01-04 ENCOUNTER — Other Ambulatory Visit: Payer: Self-pay

## 2020-01-04 ENCOUNTER — Ambulatory Visit (INDEPENDENT_AMBULATORY_CARE_PROVIDER_SITE_OTHER): Payer: 59 | Admitting: Family Medicine

## 2020-01-04 ENCOUNTER — Encounter: Payer: Self-pay | Admitting: Family Medicine

## 2020-01-04 VITALS — BP 129/79 | HR 96 | Temp 97.0°F | Ht 69.0 in | Wt 186.0 lb

## 2020-01-04 DIAGNOSIS — Z0001 Encounter for general adult medical examination with abnormal findings: Secondary | ICD-10-CM | POA: Diagnosis not present

## 2020-01-04 DIAGNOSIS — F329 Major depressive disorder, single episode, unspecified: Secondary | ICD-10-CM

## 2020-01-04 DIAGNOSIS — E7849 Other hyperlipidemia: Secondary | ICD-10-CM

## 2020-01-04 DIAGNOSIS — F411 Generalized anxiety disorder: Secondary | ICD-10-CM

## 2020-01-04 DIAGNOSIS — Z0000 Encounter for general adult medical examination without abnormal findings: Secondary | ICD-10-CM

## 2020-01-04 DIAGNOSIS — D508 Other iron deficiency anemias: Secondary | ICD-10-CM | POA: Diagnosis not present

## 2020-01-04 DIAGNOSIS — E559 Vitamin D deficiency, unspecified: Secondary | ICD-10-CM

## 2020-01-04 DIAGNOSIS — R3912 Poor urinary stream: Secondary | ICD-10-CM

## 2020-01-04 MED ORDER — ROSUVASTATIN CALCIUM 10 MG PO TABS: 10.0000 mg | ORAL_TABLET | Freq: Every day | ORAL | 3 refills | Status: DC

## 2020-01-04 MED ORDER — ESCITALOPRAM OXALATE 10 MG PO TABS: 10.0000 mg | ORAL_TABLET | Freq: Every day | ORAL | 3 refills | Status: DC

## 2020-01-04 MED ORDER — ALPRAZOLAM 0.5 MG PO TABS: 0.5000 mg | ORAL_TABLET | Freq: Every day | ORAL | 0 refills | Status: DC | PRN

## 2020-01-04 MED ORDER — METHOCARBAMOL 500 MG PO TABS: 500.0000 mg | ORAL_TABLET | Freq: Three times a day (TID) | ORAL | 5 refills | Status: DC | PRN

## 2020-01-04 NOTE — Progress Notes (Signed)
Joseph Oliver is a 59 y.o. male presents to office today for annual physical exam examination.    Concerns today include: 1. none  Diet: balanced Last colonoscopy: UTD  Past Medical History:  Diagnosis Date  . Anemia    Hx of   . GERD (gastroesophageal reflux disease)   . Hyperlipemia   . Vitamin D deficiency    Social History   Socioeconomic History  . Marital status: Divorced    Spouse name: Not on file  . Number of children: 0  . Years of education: Not on file  . Highest education level: Not on file  Occupational History  . Occupation: Programme researcher, broadcasting/film/video   Tobacco Use  . Smoking status: Never Smoker  . Smokeless tobacco: Never Used  Vaping Use  . Vaping Use: Never used  Substance and Sexual Activity  . Alcohol use: Yes    Comment: occasional   . Drug use: No  . Sexual activity: Not on file  Other Topics Concern  . Not on file  Social History Narrative  . Not on file   Social Determinants of Health   Financial Resource Strain:   . Difficulty of Paying Living Expenses:   Food Insecurity:   . Worried About Charity fundraiser in the Last Year:   . Arboriculturist in the Last Year:   Transportation Needs:   . Film/video editor (Medical):   Marland Kitchen Lack of Transportation (Non-Medical):   Physical Activity:   . Days of Exercise per Week:   . Minutes of Exercise per Session:   Stress:   . Feeling of Stress :   Social Connections:   . Frequency of Communication with Friends and Family:   . Frequency of Social Gatherings with Friends and Family:   . Attends Religious Services:   . Active Member of Clubs or Organizations:   . Attends Archivist Meetings:   Marland Kitchen Marital Status:   Intimate Partner Violence:   . Fear of Current or Ex-Partner:   . Emotionally Abused:   Marland Kitchen Physically Abused:   . Sexually Abused:    Past Surgical History:  Procedure Laterality Date  . HAND SURGERY     Left   . NECK SURGERY    . RHINOPLASTY    . SPINAL FUSION      Family History  Problem Relation Age of Onset  . Drug abuse Mother   . Cancer Mother        lung  . Alzheimer's disease Mother   . Colon cancer Neg Hx   . Rectal cancer Neg Hx   . Stomach cancer Neg Hx     Current Outpatient Medications:  .  ALPRAZolam (XANAX) 0.5 MG tablet, Take 1 tablet (0.5 mg total) by mouth daily as needed for anxiety., Disp: 30 tablet, Rfl: 0 .  calcium-vitamin D (OSCAL 500/200 D-3) 500-200 MG-UNIT per tablet, Take 1 tablet by mouth daily., Disp: , Rfl:  .  escitalopram (LEXAPRO) 10 MG tablet, Take 1 tablet (10 mg total) by mouth daily., Disp: 90 tablet, Rfl: 3 .  Ferrous Sulfate (IRON) 325 (65 FE) MG TABS, Take 1 tablet by mouth daily., Disp: , Rfl:  .  methocarbamol (ROBAXIN) 500 MG tablet, TAKE 1 TABLET (500 MG TOTAL) BY MOUTH EVERY 6 (SIX) HOURS AS NEEDED FOR MUSCLE SPASMS., Disp: 120 tablet, Rfl: 5 .  Multiple Vitamins-Minerals (CENTRUM SILVER PO), Take 1 capsule by mouth daily., Disp: , Rfl:  .  rosuvastatin (CRESTOR) 10  MG tablet, Take 1 tablet (10 mg total) by mouth daily., Disp: 30 tablet, Rfl: 0 .  VITAMIN D, CHOLECALCIFEROL, PO, Take by mouth as directed., Disp: , Rfl:   No Known Allergies   ROS: Review of Systems A comprehensive review of systems was negative except for: Genitourinary: positive for decreased stream Musculoskeletal: positive for back pain and neck pain    Physical exam BP 129/79   Pulse 96   Temp (!) 97 F (36.1 C) (Temporal)   Ht '5\' 9"'$  (1.753 m)   Wt 186 lb (84.4 kg)   SpO2 97%   BMI 27.47 kg/m  General appearance: alert, cooperative, appears stated age and no distress Head: Normocephalic, without obvious abnormality, atraumatic Eyes: negative findings: lids and lashes normal, conjunctivae and sclerae normal, corneas clear and pupils equal, round, reactive to light and accomodation Ears: normal TM's and external ear canals both ears Nose: Nares normal. Septum midline. Mucosa normal. No drainage or sinus  tenderness. Throat: lips, mucosa, and tongue normal; teeth and gums normal Neck: no adenopathy, no carotid bruit, supple, symmetrical, trachea midline, thyroid not enlarged, symmetric, no tenderness/mass/nodules and Well-healed scar noted at the left lateral aspect of the neck Back: symmetric, no curvature. ROM normal. No CVA tenderness. Lungs: clear to auscultation bilaterally Chest wall: no tenderness Heart: regular rate and rhythm, S1, S2 normal, no murmur, click, rub or gallop Abdomen: soft, non-tender; bowel sounds normal; no masses,  no organomegaly Extremities: extremities normal, atraumatic, no cyanosis or edema Pulses: 2+ and symmetric Skin: Skin color, texture, turgor normal. No rashes or lesions Lymph nodes: Cervical, supraclavicular, and axillary nodes normal. Neurologic: Alert and oriented X 3, normal strength and tone. Normal symmetric reflexes. Normal coordination and gait Psych: Mood stable, speech normal, after appropriate, pleasant and interactive  Depression screen [facility] 2/9 01/04/2020 08/25/2018 06/30/2018  Decreased Interest 0 2 2  Down, Depressed, Hopeless 0 2 2  PHQ - 2 Score 0 4 4  Altered sleeping 0 0 2  Tired, decreased energy 0 2 2  Change in appetite 0 2 0  Feeling bad or failure about yourself  0 0 0  Trouble concentrating 0 0 0  Moving slowly or fidgety/restless 0 0 0  Suicidal thoughts 0 0 0  PHQ-9 Score 0 8 8  Difficult doing work/chores - Somewhat difficult Not difficult at all   Assessment/ Plan: Joseph Oliver here for annual physical exam.   1. Annual physical exam  2. Generalized anxiety disorder Stable - escitalopram (LEXAPRO) 10 MG tablet; Take 1 tablet (10 mg total) by mouth daily.  Dispense: 90 tablet; Refill: 3 - ALPRAZolam (XANAX) 0.5 MG tablet; Take 1 tablet (0.5 mg total) by mouth daily as needed for anxiety.  Dispense: 30 tablet; Refill: 0  3. Other hyperlipidemia Check lipid - CMP14+EGFR - Lipid Panel  4. Other iron deficiency  anemia - CBC  5. Vitamin D deficiency - VITAMIN D 25 Hydroxy (Vit-D Deficiency, Fractures)  6. Weak urinary stream Check PSA - PSA  7. Reactive depression Stable. The Narcotic Database has been reviewed.  There were no red flags.   - escitalopram (LEXAPRO) 10 MG tablet; Take 1 tablet (10 mg total) by mouth daily.  Dispense: 90 tablet; Refill: 3 - ALPRAZolam (XANAX) 0.5 MG tablet; Take 1 tablet (0.5 mg total) by mouth daily as needed for anxiety.  Dispense: 30 tablet; Refill: 0  Counseled on healthy lifestyle choices, including diet (rich in fruits, vegetables and lean meats and low in salt and simple  carbohydrates) and exercise (at least 30 minutes of moderate physical activity daily).  Patient to follow up in 1 year for annual exam or sooner if needed.  Ozro Russett M. Lajuana Ripple, DO

## 2020-01-04 NOTE — Patient Instructions (Addendum)
You had labs performed today.  You will be contacted with the results of the labs once they are available, usually in the next 3 business days for routine lab work.  If you have an active my chart account, they will be released to your MyChart.  If you prefer to have these labs released to you via telephone, please let us know.  If you had a pap smear or biopsy performed, expect to be contacted in about 7-10 days.   Preventive Care 40-60 Years Old, Male Preventive care refers to lifestyle choices and visits with your health care provider that can promote health and wellness. This includes:  A yearly physical exam. This is also called an annual well check.  Regular dental and eye exams.  Immunizations.  Screening for certain conditions.  Healthy lifestyle choices, such as eating a healthy diet, getting regular exercise, not using drugs or products that contain nicotine and tobacco, and limiting alcohol use. What can I expect for my preventive care visit? Physical exam Your health care provider will check:  Height and weight. These may be used to calculate body mass index (BMI), which is a measurement that tells if you are at a healthy weight.  Heart rate and blood pressure.  Your skin for abnormal spots. Counseling Your health care provider may ask you questions about:  Alcohol, tobacco, and drug use.  Emotional well-being.  Home and relationship well-being.  Sexual activity.  Eating habits.  Work and work environment. What immunizations do I need?  Influenza (flu) vaccine  This is recommended every year. Tetanus, diphtheria, and pertussis (Tdap) vaccine  You may need a Td booster every 10 years. Varicella (chickenpox) vaccine  You may need this vaccine if you have not already been vaccinated. Zoster (shingles) vaccine  You may need this after age 60. Measles, mumps, and rubella (MMR) vaccine  You may need at least one dose of MMR if you were born in 1957 or  later. You may also need a second dose. Pneumococcal conjugate (PCV13) vaccine  You may need this if you have certain conditions and were not previously vaccinated. Pneumococcal polysaccharide (PPSV23) vaccine  You may need one or two doses if you smoke cigarettes or if you have certain conditions. Meningococcal conjugate (MenACWY) vaccine  You may need this if you have certain conditions. Hepatitis A vaccine  You may need this if you have certain conditions or if you travel or work in places where you may be exposed to hepatitis A. Hepatitis B vaccine  You may need this if you have certain conditions or if you travel or work in places where you may be exposed to hepatitis B. Haemophilus influenzae type b (Hib) vaccine  You may need this if you have certain risk factors. Human papillomavirus (HPV) vaccine  If recommended by your health care provider, you may need three doses over 6 months. You may receive vaccines as individual doses or as more than one vaccine together in one shot (combination vaccines). Talk with your health care provider about the risks and benefits of combination vaccines. What tests do I need? Blood tests  Lipid and cholesterol levels. These may be checked every 5 years, or more frequently if you are over 50 years old.  Hepatitis C test.  Hepatitis B test. Screening  Lung cancer screening. You may have this screening every year starting at age 55 if you have a 30-pack-year history of smoking and currently smoke or have quit within the past 15 years.    Prostate cancer screening. Recommendations will vary depending on your family history and other risks.  Colorectal cancer screening. All adults should have this screening starting at age 50 and continuing until age 75. Your health care provider may recommend screening at age 45 if you are at increased risk. You will have tests every 1-10 years, depending on your results and the type of screening  test.  Diabetes screening. This is done by checking your blood sugar (glucose) after you have not eaten for a while (fasting). You may have this done every 1-3 years.  Sexually transmitted disease (STD) testing. Follow these instructions at home: Eating and drinking  Eat a diet that includes fresh fruits and vegetables, whole grains, lean protein, and low-fat dairy products.  Take vitamin and mineral supplements as recommended by your health care provider.  Do not drink alcohol if your health care provider tells you not to drink.  If you drink alcohol: ? Limit how much you have to 0-2 drinks a day. ? Be aware of how much alcohol is in your drink. In the [place] one drink equals one 12 oz bottle of beer (355 mL), one 5 oz glass of wine (148 mL), or one 1 oz glass of hard liquor (44 mL). Lifestyle  Take daily care of your teeth and gums.  Stay active. Exercise for at least 30 minutes on 5 or more days each week.  Do not use any products that contain nicotine or tobacco, such as cigarettes, e-cigarettes, and chewing tobacco. If you need help quitting, ask your health care provider.  If you are sexually active, practice safe sex. Use a condom or other form of protection to prevent STIs (sexually transmitted infections).  Talk with your health care provider about taking a low-dose aspirin every day starting at age 50. What's next?  Go to your health care provider once a year for a well check visit.  Ask your health care provider how often you should have your eyes and teeth checked.  Stay up to date on all vaccines. This information is not intended to replace advice given to you by your health care provider. Make sure you discuss any questions you have with your health care provider. Document Revised: 05/27/2018 Document Reviewed: 05/27/2018 Elsevier Patient Education  2020 [employer]vier Inc[employer]   

## 2020-01-05 LAB — CMP14+EGFR
ALT: 16 IU/L (ref 0–44)
AST: 19 IU/L (ref 0–40)
Albumin/Globulin Ratio: 2 (ref 1.2–2.2)
Albumin: 4.4 g/dL (ref 3.8–4.9)
Alkaline Phosphatase: 71 IU/L (ref 48–121)
BUN/Creatinine Ratio: 13 (ref 9–20)
BUN: 12 mg/dL (ref 6–24)
Bilirubin Total: 0.4 mg/dL (ref 0.0–1.2)
CO2: 26 mmol/L (ref 20–29)
Calcium: 9.2 mg/dL (ref 8.7–10.2)
Chloride: 101 mmol/L (ref 96–106)
Creatinine, Ser: 0.9 mg/dL (ref 0.76–1.27)
GFR calc Af Amer: 108 mL/min/{1.73_m2} (ref >59)
GFR calc non Af Amer: 93 mL/min/{1.73_m2} (ref >59)
Globulin, Total: 2.2 g/dL (ref 1.5–4.5)
Glucose: 110 mg/dL — ABNORMAL HIGH (ref 65–99)
Potassium: 4.7 mmol/L (ref 3.5–5.2)
Sodium: 138 mmol/L (ref 134–144)
Total Protein: 6.6 g/dL (ref 6.0–8.5)

## 2020-01-05 LAB — LIPID PANEL
Chol/HDL Ratio: 2.7 ratio (ref 0.0–5.0)
Cholesterol, Total: 153 mg/dL (ref 100–199)
HDL: 57 mg/dL (ref >39)
LDL Chol Calc (NIH): 77 mg/dL (ref 0–99)
Triglycerides: 107 mg/dL (ref 0–149)
VLDL Cholesterol Cal: 19 mg/dL (ref 5–40)

## 2020-01-05 LAB — PSA: Prostate Specific Ag, Serum: 0.3 ng/mL (ref 0.0–4.0)

## 2020-01-05 LAB — VITAMIN D 25 HYDROXY (VIT D DEFICIENCY, FRACTURES): Vit D, 25-Hydroxy: 39 ng/mL (ref 30.0–100.0)

## 2020-01-05 LAB — CBC
Hematocrit: 44.1 % (ref 37.5–51.0)
Hemoglobin: 14.5 g/dL (ref 13.0–17.7)
MCH: 29.2 pg (ref 26.6–33.0)
MCHC: 32.9 g/dL (ref 31.5–35.7)
MCV: 89 fL (ref 79–97)
Platelets: 297 10*3/uL (ref 150–450)
RBC: 4.96 x10E6/uL (ref 4.14–5.80)
RDW: 13.1 % (ref 11.6–15.4)
WBC: 10.1 10*3/uL (ref 3.4–10.8)

## 2020-05-31 ENCOUNTER — Other Ambulatory Visit: Payer: Self-pay | Admitting: Family Medicine

## 2020-05-31 NOTE — Telephone Encounter (Signed)
Last office visit for physical 01/04/20, told to follow up in one year or prn  Last refill 01/04/20, 120, 5 refills

## 2020-06-18 ENCOUNTER — Encounter: Payer: Self-pay | Admitting: Internal Medicine

## 2020-06-27 ENCOUNTER — Other Ambulatory Visit: Payer: Self-pay | Admitting: Family Medicine

## 2020-06-27 DIAGNOSIS — F329 Major depressive disorder, single episode, unspecified: Secondary | ICD-10-CM

## 2020-06-27 DIAGNOSIS — F411 Generalized anxiety disorder: Secondary | ICD-10-CM

## 2020-07-05 ENCOUNTER — Other Ambulatory Visit: Payer: Self-pay

## 2020-07-05 ENCOUNTER — Ambulatory Visit (AMBULATORY_SURGERY_CENTER): Payer: Self-pay

## 2020-07-05 VITALS — Ht 69.0 in | Wt 186.0 lb

## 2020-07-05 DIAGNOSIS — Z86010 Personal history of colonic polyps: Secondary | ICD-10-CM

## 2020-07-05 MED ORDER — NA SULFATE-K SULFATE-MG SULF 17.5-3.13-1.6 GM/177ML PO SOLN
1.0000 | Freq: Once | ORAL | 0 refills | Status: AC
Start: 2020-07-05 — End: 2020-07-05

## 2020-07-05 NOTE — Progress Notes (Signed)
No allergies to soy or egg Pt is not on blood thinners or diet pills Denies issues with sedation/intubation Denies atrial flutter/fib Denies constipation    Pt is aware of Covid safety and care partner requirements.  Pt told to stop Iron 5 days prior to procedure.

## 2020-07-23 ENCOUNTER — Telehealth: Payer: Self-pay | Admitting: *Deleted

## 2020-07-23 ENCOUNTER — Encounter: Payer: 59 | Admitting: Internal Medicine

## 2020-07-23 NOTE — Telephone Encounter (Signed)
New instructions printed and left with a Plenvu sample at the 3rd floor front desk.

## 2020-07-23 NOTE — Telephone Encounter (Signed)
Pt has rescheduled his procedure to 08/17/20 at 4pm due to not stopping his Iron 5 days prior to his procedure because his instructions were incorrect. Dr. Hilarie Fredrickson was made aware. A sample of Suprep and instructions will be left for him at the front desk.

## 2020-08-16 ENCOUNTER — Telehealth: Payer: Self-pay | Admitting: Internal Medicine

## 2020-08-16 NOTE — Telephone Encounter (Signed)
Returned pts call.  He ate a full breakfast (eggs and sausage) and lunch as well (roast beef sub with fries).  He says he forgot.  He would like to reschedule because he doesn't want his exam to be "compromised".  Rescheduled pts procedure to Thursday 3/10 at 3:30pm

## 2020-08-17 ENCOUNTER — Telehealth: Payer: Self-pay

## 2020-08-17 ENCOUNTER — Encounter: Payer: 59 | Admitting: Internal Medicine

## 2020-08-17 NOTE — Telephone Encounter (Signed)
Pt came by - appt made for 3/8

## 2020-08-17 NOTE — Telephone Encounter (Signed)
Pt wants to see GOTTSCHALK before 08/29/2020. He wants to discuss his ALPRAZolam Duanne Moron) 0.5 MG tablet medication and stopping it due to his colonoscopy procedure. When I offered 08/29/2020 he said that it would be necessary by then. Please call back if we can get in sooner and cancel apt 08/29/2020.

## 2020-08-21 ENCOUNTER — Other Ambulatory Visit: Payer: Self-pay

## 2020-08-21 ENCOUNTER — Ambulatory Visit (INDEPENDENT_AMBULATORY_CARE_PROVIDER_SITE_OTHER): Payer: 59 | Admitting: Family Medicine

## 2020-08-21 VITALS — BP 136/69 | HR 72 | Temp 98.1°F | Ht 69.0 in | Wt 187.0 lb

## 2020-08-21 DIAGNOSIS — Z23 Encounter for immunization: Secondary | ICD-10-CM

## 2020-08-21 DIAGNOSIS — M5030 Other cervical disc degeneration, unspecified cervical region: Secondary | ICD-10-CM

## 2020-08-21 DIAGNOSIS — M62838 Other muscle spasm: Secondary | ICD-10-CM | POA: Diagnosis not present

## 2020-08-21 DIAGNOSIS — S61212A Laceration without foreign body of right middle finger without damage to nail, initial encounter: Secondary | ICD-10-CM

## 2020-08-21 MED ORDER — TIZANIDINE HCL 4 MG PO TABS: 2.0000 mg | ORAL_TABLET | Freq: Three times a day (TID) | ORAL | 2 refills | Status: DC | PRN

## 2020-08-21 NOTE — Addendum Note (Signed)
Addended byCarrolyn Leigh on: 08/21/2020 11:43 AM   Modules accepted: Orders

## 2020-08-21 NOTE — Progress Notes (Signed)
Subjective: CC: DDD of C-spine PCP: Joseph Norlander, DO YWV:[id number] Joseph Oliver is a 60 y.o. male presenting to clinic today for:  1.  Neck spasm Patient reports intermittent severe neck spasm.  He has no degenerative changes within the C-spine and was under the care of Joseph Oliver.  He was prescribing him a combination of methocarbamol and Zanaflex as needed breakthrough spasm.  He had been using his alprazolam for as needed spasm which does seem to work well.  He admits to marijuana use intermittently.  Denies excessive daytime sedation, falls, respiratory depression  2.  Finger laceration Patient reports finger lacerations on bilateral hands.  He did cut through the knuckle on the left but notes that that is healed up.  He has 1 little spot that seems to protrude and he wanted to ask about that today.  Not sure if he is up-to-date on tetanus.  Has a history of fracture in the left hand.  No reports of warmth, erythema or drainage   ROS: Per HPI  No Known Allergies Past Medical History:  Diagnosis Date  . Anemia    Hx of   . Arthritis    neck  . GERD (gastroesophageal reflux disease)   . Glaucoma    ocular hypertension-being watched.  . Hyperlipemia   . Vitamin D deficiency     Current Outpatient Medications:  .  ALPRAZolam (XANAX) 0.5 MG tablet, Take 1 tablet (0.5 mg total) by mouth daily as needed for anxiety., Disp: 30 tablet, Rfl: 0 .  calcium-vitamin D (OSCAL WITH D) 500-200 MG-UNIT tablet, Take 1 tablet by mouth daily., Disp: , Rfl:  .  Ferrous Sulfate (IRON) 325 (65 FE) MG TABS, Take 1 tablet by mouth daily., Disp: , Rfl:  .  methocarbamol (ROBAXIN) 500 MG tablet, TAKE 1 TABLET (500 MG TOTAL) BY MOUTH EVERY 6 (SIX) HOURS AS NEEDED FOR MUSCLE SPASMS., Disp: 120 tablet, Rfl: 5 .  Multiple Vitamins-Minerals (CENTRUM SILVER PO), Take 1 capsule by mouth daily., Disp: , Rfl:  .  rosuvastatin (CRESTOR) 10 MG tablet, Take 1 tablet (10 mg total) by mouth daily., Disp: 90 tablet,  Rfl: 3 .  tiZANidine (ZANAFLEX) 4 MG tablet, Take 0.5-1 tablets (2-4 mg total) by mouth every 8 (eight) hours as needed for muscle spasms., Disp: 30 tablet, Rfl: 2 .  VITAMIN D, CHOLECALCIFEROL, PO, Take by mouth as directed., Disp: , Rfl:  Social History   Socioeconomic History  . Marital status: Divorced    Spouse name: Not on file  . Number of children: 0  . Years of education: Not on file  . Highest education level: Not on file  Occupational History  . Occupation: Joseph [employer]   Tobacco Use  . Smoking status: Never Smoker  . Smokeless tobacco: Never Used  Vaping Use  . Vaping Use: Never used  Substance and Sexual Activity  . Alcohol use: Yes    Comment: occasional   . Drug use: No  . Sexual activity: Not on file  Other Topics Concern  . Not on file  Social History Narrative  . Not on file   Social Determinants of Health   Financial Resource Strain: Not on file  Food Insecurity: Not on file  Transportation Needs: Not on file  Physical Activity: Not on file  Stress: Not on file  Social Connections: Not on file  Intimate Partner Violence: Not on file   Family History  Problem Relation Age of Onset  . Drug abuse Mother   .  Cancer Mother        lung  . Alzheimer's disease Mother   . Colon cancer Neg Hx   . Rectal cancer Neg Hx   . Stomach cancer Neg Hx   . Colon polyps Neg Hx   . Esophageal cancer Neg Hx     Objective: Office vital signs reviewed. BP 136/69   Pulse 72   Temp 98.1 F (36.7 C) (Temporal)   Ht 5\' 9"  (1.753 m)   Wt 187 lb (84.8 kg)   SpO2 97%   BMI 27.62 kg/m   Physical Examination:  General: Awake, alert, well nourished, No acute distress Cardio: regular rate and rhythm, S1S2 heard, no murmurs appreciated Extremities: warm, well perfused, No edema, cyanosis or clubbing; +2 pulses bilaterally MSK: Curvilinear healing laceration noted along the left pointer finger and right middle finger.  No evidence of infection  C-spine: Reduced  extension.  Appears to have full flexion.  Minimally reduced rotation.  He has crepitus with rotation  Assessment/ Plan: 60 y.o. male   DDD (degenerative disc disease), cervical - Plan: tiZANidine (ZANAFLEX) 4 MG tablet  Neck muscle spasm - Plan: tiZANidine (ZANAFLEX) 4 MG tablet  Laceration of right middle finger without foreign body without damage to nail, initial encounter  Ongoing degenerative changes within the C-spine.  His neurosurgeon was given him a combo of methocarbamol and as needed Zanaflex.  He notes very extremely rare use of this and needs a renewal on the Zanaflex and therefore this was provided.  Caution sedation as these medications are similar.  He is to follow-up with Joseph Oliver for ongoing needs if symptoms persist.  Laceration is healing.  No evidence of infection.  He had a similar more healed lesion on the left hand as well.  He was given a tetanus shot today as he was due at the end of the year.  No orders of the defined types were placed in this encounter.  Meds ordered this encounter  Medications  . tiZANidine (ZANAFLEX) 4 MG tablet    Sig: Take 0.5-1 tablets (2-4 mg total) by mouth every 8 (eight) hours as needed for muscle spasms.    Dispense:  30 tablet    Refill:  [facility], [place] [phone]

## 2020-08-23 ENCOUNTER — Other Ambulatory Visit: Payer: Self-pay

## 2020-08-23 ENCOUNTER — Ambulatory Visit (AMBULATORY_SURGERY_CENTER): Payer: 59 | Admitting: Internal Medicine

## 2020-08-23 ENCOUNTER — Encounter: Payer: Self-pay | Admitting: Internal Medicine

## 2020-08-23 VITALS — BP 124/77 | HR 61 | Temp 97.8°F | Resp 9 | Ht 69.0 in | Wt 186.0 lb

## 2020-08-23 DIAGNOSIS — D123 Benign neoplasm of transverse colon: Secondary | ICD-10-CM | POA: Diagnosis not present

## 2020-08-23 DIAGNOSIS — Z86010 Personal history of colonic polyps: Secondary | ICD-10-CM

## 2020-08-23 MED ORDER — SODIUM CHLORIDE 0.9 % IV SOLN: 500.0000 mL | Freq: Once | INTRAVENOUS | Status: DC

## 2020-08-23 NOTE — Progress Notes (Signed)
Medical history reviewed with no changes noted. VS assessed by C.W 

## 2020-08-23 NOTE — Progress Notes (Unsigned)
Report to PACU, RN, vss, BBS= Clear.  

## 2020-08-23 NOTE — Op Note (Signed)
[facility] Patient Name: Joseph Oliver Procedure Date: 08/23/2020 3:10 PM MRN: [id number] Endoscopist: Jerene Bears , MD Age: 60 Referring MD:  Date of Birth: 1961-06-11 Gender: Male Account #: [id number] Procedure:                Colonoscopy Indications:              High risk colon cancer surveillance: Personal                            history of colonic polyps, Last colonoscopy: August                            2016 (hyperplastic polyps); 2013 (tubular adenoma                            and sessile serrated polyps) Medicines:                Monitored Anesthesia Care Procedure:                Pre-Anesthesia Assessment:                           - Prior to the procedure, a History and Physical                            was performed, and patient medications and                            allergies were reviewed. The patient's tolerance of                            previous anesthesia was also reviewed. The risks                            and benefits of the procedure and the sedation                            options and risks were discussed with the patient.                            All questions were answered, and informed consent                            was obtained. Prior Anticoagulants: The patient has                            taken no previous anticoagulant or antiplatelet                            agents. ASA Grade Assessment: II - A patient with                            mild systemic disease. After reviewing the risks  and benefits, the patient was deemed in                            satisfactory condition to undergo the procedure.                           After obtaining informed consent, the colonoscope                            was passed under direct vision. Throughout the                            procedure, the patient's blood pressure, pulse, and                            oxygen saturations were monitored  continuously. The                            Colonoscope was introduced through the anus and                            advanced to the terminal ileum. The colonoscopy was                            performed without difficulty. The patient tolerated                            the procedure well. The quality of the bowel                            preparation was good. The terminal ileum, ileocecal                            valve, appendiceal orifice, and rectum were                            photographed. Scope In: 3:15:25 PM Scope Out: 3:25:07 PM Scope Withdrawal Time: 0 hours 8 minutes 2 seconds  Total Procedure Duration: 0 hours 9 minutes 42 seconds  Findings:                 The digital rectal exam was normal.                           Two sessile polyps were found in the transverse                            colon. The polyps were 3 to 5 mm in size. These                            polyps were removed with a cold snare. Resection                            and retrieval were complete.  A single small-mouthed diverticulum was found in                            the cecum.                           Internal hemorrhoids were found during                            retroflexion. The hemorrhoids were small.                           The exam was otherwise without abnormality. Complications:            No immediate complications. Estimated Blood Loss:     Estimated blood loss: none. Impression:               - Two 3 to 5 mm polyps in the transverse colon,                            removed with a cold snare. Resected and retrieved.                           - Diverticulosis in the cecum.                           - Small internal hemorrhoids.                           - The examination was otherwise normal. Recommendation:           - Patient has a contact number available for                            emergencies. The signs and symptoms of potential                             delayed complications were discussed with the                            patient. Return to normal activities tomorrow.                            Written discharge instructions were provided to the                            patient.                           - Resume previous diet.                           - Continue present medications.                           - Await pathology results.                           -  Repeat colonoscopy is recommended for                            surveillance. The colonoscopy date will be                            determined after pathology results from today's                            exam become available for review. Jerene Bears, MD 08/23/2020 3:29:29 PM This report has been signed electronically.

## 2020-08-23 NOTE — Progress Notes (Signed)
Called to room to assist during endoscopic procedure.  Patient ID and intended procedure confirmed with present staff. Received instructions for my participation in the procedure from the performing physician.  

## 2020-08-23 NOTE — Patient Instructions (Signed)
YOU HAD AN ENDOSCOPIC PROCEDURE TODAY AT THE [facility]:   Refer to the procedure report that was given to you for any specific questions about what was found during the examination.  If the procedure report does not answer your questions, please call your gastroenterologist to clarify.  If you requested that your care partner not be given the details of your procedure findings, then the procedure report has been included in a sealed envelope for you to review at your convenience later.  YOU SHOULD EXPECT: Some feelings of bloating in the abdomen. Passage of more gas than usual.  Walking can help get rid of the air that was put into your GI tract during the procedure and reduce the bloating. If you had a lower endoscopy (such as a colonoscopy or flexible sigmoidoscopy) you may notice spotting of blood in your stool or on the toilet paper. If you underwent a bowel prep for your procedure, you may not have a normal bowel movement for a few days.  Please Note:  You might notice some irritation and congestion in your nose or some drainage.  This is from the oxygen used during your procedure.  There is no need for concern and it should clear up in a day or so.  SYMPTOMS TO REPORT IMMEDIATELY:   Following lower endoscopy (colonoscopy or flexible sigmoidoscopy):  Excessive amounts of blood in the stool  Significant tenderness or worsening of abdominal pains  Swelling of the abdomen that is new, acute  Fever of 100F or higher   Following upper endoscopy (EGD)  Vomiting of blood or coffee ground material  New chest pain or pain under the shoulder blades  Painful or persistently difficult swallowing  New shortness of breath  Fever of 100F or higher  Black, tarry-looking stools  For urgent or emergent issues, a gastroenterologist can be reached at any hour by calling [phone]. Do not use MyChart messaging for urgent concerns.    DIET:  We do recommend a small meal at first, but  then you may proceed to your regular diet.  Drink plenty of fluids but you should avoid alcoholic beverages for 24 hours.  ACTIVITY:  You should plan to take it easy for the rest of today and you should NOT DRIVE or use heavy machinery until tomorrow (because of the sedation medicines used during the test).    FOLLOW UP: Our staff will call the number listed on your records 48-72 hours following your procedure to check on you and address any questions or concerns that you may have regarding the information given to you following your procedure. If we do not reach you, we will leave a message.  We will attempt to reach you two times.  During this call, we will ask if you have developed any symptoms of COVID 19. If you develop any symptoms (ie: fever, flu-like symptoms, shortness of breath, cough etc.) before then, please call [phone].  If you test positive for Covid 19 in the 2 weeks post procedure, please call and report this information to us.    If any biopsies were taken you will be contacted by phone or by letter within the next 1-3 weeks.  Please call us at [phone] if you have not heard about the biopsies in 3 weeks.    SIGNATURES/CONFIDENTIALITY: You and/or your care partner have signed paperwork which will be entered into your electronic medical record.  These signatures attest to the fact that that the information above on   your After Visit Summary has been reviewed and is understood.  Full responsibility of the confidentiality of this discharge information lies with you and/or your care-partner. 

## 2020-08-28 ENCOUNTER — Telehealth: Payer: Self-pay | Admitting: *Deleted

## 2020-08-28 NOTE — Telephone Encounter (Signed)
  Follow up Call-  Call back number 08/23/2020  Post procedure Call Back phone  # [phone]  Permission to leave phone message Yes  Some recent data might be hidden     Patient questions:  Do you have a fever, pain , or abdominal swelling? No. Pain Score  0 *  Have you tolerated food without any problems? Yes.    Have you been able to return to your normal activities? Yes.    Do you have any questions about your discharge instructions: Diet   No. Medications  No. Follow up visit  No.  Do you have questions or concerns about your Care? No.  Actions: * If pain score is 4 or above: No action needed, pain <4.  1. Have you developed a fever since your procedure? no  2.   Have you had an respiratory symptoms (SOB or cough) since your procedure? no  3.   Have you tested positive for COVID 19 since your procedure no  4.   Have you had any family members/close contacts diagnosed with the COVID 19 since your procedure?  no   If yes to any of these questions please route to Joylene John, RN and Joella Prince, RN

## 2020-08-29 ENCOUNTER — Ambulatory Visit: Payer: 59 | Admitting: Family Medicine

## 2020-09-05 ENCOUNTER — Encounter: Payer: Self-pay | Admitting: Internal Medicine

## 2020-11-10 ENCOUNTER — Other Ambulatory Visit: Payer: Self-pay | Admitting: Family Medicine

## 2021-02-21 ENCOUNTER — Other Ambulatory Visit: Payer: Self-pay | Admitting: Family

## 2021-03-13 ENCOUNTER — Other Ambulatory Visit: Payer: Self-pay | Admitting: Family Medicine

## 2021-04-01 ENCOUNTER — Other Ambulatory Visit: Payer: Self-pay | Admitting: Family Medicine

## 2021-04-24 ENCOUNTER — Other Ambulatory Visit: Payer: Self-pay | Admitting: Family Medicine

## 2021-04-24 MED ORDER — ROSUVASTATIN CALCIUM 10 MG PO TABS: 10.0000 mg | ORAL_TABLET | Freq: Every day | ORAL | 0 refills | Status: DC

## 2021-04-24 NOTE — Telephone Encounter (Signed)
  Prescription Request  04/24/2021  Is this a "Controlled Substance" medicine? no  Have you seen your PCP in the last 2 weeks? No, appt scheduled for 1/17  If YES, route message to pool  -  If NO, patient needs to be scheduled for appointment.  What is the name of the medication or equipment? rosuvastatin (CRESTOR) 10 MG tablet  Have you contacted your pharmacy to request a refill? Yes, they refilled for 30 days and said he had to be seen    Which pharmacy would you like this sent to? [id number] - [place], [place] (Ph: [phone])   Patient notified that their request is being sent to the clinical staff for review and that they should receive a response within 2 business days.

## 2021-04-24 NOTE — Telephone Encounter (Signed)
Pt aware refill was sent to pharmacy

## 2021-04-30 ENCOUNTER — Ambulatory Visit: Payer: 59 | Admitting: Family Medicine

## 2021-07-02 ENCOUNTER — Ambulatory Visit (INDEPENDENT_AMBULATORY_CARE_PROVIDER_SITE_OTHER): Payer: 59 | Admitting: Family Medicine

## 2021-07-02 ENCOUNTER — Encounter: Payer: Self-pay | Admitting: Family Medicine

## 2021-07-02 VITALS — BP 126/77 | HR 81 | Temp 96.9°F | Ht 69.0 in | Wt 184.6 lb

## 2021-07-02 DIAGNOSIS — E7849 Other hyperlipidemia: Secondary | ICD-10-CM | POA: Diagnosis not present

## 2021-07-02 DIAGNOSIS — Z0001 Encounter for general adult medical examination with abnormal findings: Secondary | ICD-10-CM | POA: Diagnosis not present

## 2021-07-02 DIAGNOSIS — Z0000 Encounter for general adult medical examination without abnormal findings: Secondary | ICD-10-CM

## 2021-07-02 DIAGNOSIS — Z23 Encounter for immunization: Secondary | ICD-10-CM

## 2021-07-02 DIAGNOSIS — F411 Generalized anxiety disorder: Secondary | ICD-10-CM

## 2021-07-02 DIAGNOSIS — Z125 Encounter for screening for malignant neoplasm of prostate: Secondary | ICD-10-CM

## 2021-07-02 DIAGNOSIS — E559 Vitamin D deficiency, unspecified: Secondary | ICD-10-CM

## 2021-07-02 DIAGNOSIS — D508 Other iron deficiency anemias: Secondary | ICD-10-CM

## 2021-07-02 DIAGNOSIS — L989 Disorder of the skin and subcutaneous tissue, unspecified: Secondary | ICD-10-CM

## 2021-07-02 MED ORDER — ROSUVASTATIN CALCIUM 10 MG PO TABS: 10.0000 mg | ORAL_TABLET | Freq: Every day | ORAL | 3 refills | Status: DC

## 2021-07-02 MED ORDER — METHOCARBAMOL 500 MG PO TABS: 500.0000 mg | ORAL_TABLET | Freq: Four times a day (QID) | ORAL | 5 refills | Status: DC | PRN
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
## 2021-07-09 ENCOUNTER — Other Ambulatory Visit: Payer: 59

## 2021-07-09 DIAGNOSIS — D508 Other iron deficiency anemias: Secondary | ICD-10-CM

## 2021-07-09 DIAGNOSIS — E559 Vitamin D deficiency, unspecified: Secondary | ICD-10-CM

## 2021-07-09 DIAGNOSIS — F411 Generalized anxiety disorder: Secondary | ICD-10-CM

## 2021-07-09 DIAGNOSIS — E7849 Other hyperlipidemia: Secondary | ICD-10-CM

## 2021-07-09 DIAGNOSIS — Z125 Encounter for screening for malignant neoplasm of prostate: Secondary | ICD-10-CM

## 2021-07-09 DIAGNOSIS — R7309 Other abnormal glucose: Secondary | ICD-10-CM

## 2021-07-10 LAB — CMP14+EGFR
ALT: 15 IU/L (ref 0–44)
AST: 19 IU/L (ref 0–40)
Albumin/Globulin Ratio: 2.1 (ref 1.2–2.2)
Albumin: 4.4 g/dL (ref 3.8–4.8)
Alkaline Phosphatase: 64 IU/L (ref 44–121)
BUN/Creatinine Ratio: 16 (ref 10–24)
BUN: 15 mg/dL (ref 8–27)
Bilirubin Total: 0.3 mg/dL (ref 0.0–1.2)
CO2: 25 mmol/L (ref 20–29)
Calcium: 8.8 mg/dL (ref 8.6–10.2)
Chloride: 101 mmol/L (ref 96–106)
Creatinine, Ser: 0.92 mg/dL (ref 0.76–1.27)
Globulin, Total: 2.1 g/dL (ref 1.5–4.5)
Glucose: 108 mg/dL — ABNORMAL HIGH (ref 70–99)
Potassium: 4.7 mmol/L (ref 3.5–5.2)
Sodium: 142 mmol/L (ref 134–144)
Total Protein: 6.5 g/dL (ref 6.0–8.5)
eGFR: 95 mL/min/{1.73_m2} (ref >59)

## 2021-07-10 LAB — LIPID PANEL
Chol/HDL Ratio: 2.1 ratio (ref 0.0–5.0)
Cholesterol, Total: 124 mg/dL (ref 100–199)
HDL: 58 mg/dL (ref >39)
LDL Chol Calc (NIH): 53 mg/dL (ref 0–99)
Triglycerides: 60 mg/dL (ref 0–149)
VLDL Cholesterol Cal: 13 mg/dL (ref 5–40)

## 2021-07-10 LAB — CBC
Hematocrit: 41.8 % (ref 37.5–51.0)
Hemoglobin: 13.8 g/dL (ref 13.0–17.7)
MCH: 29.3 pg (ref 26.6–33.0)
MCHC: 33 g/dL (ref 31.5–35.7)
MCV: 89 fL (ref 79–97)
Platelets: 286 10*3/uL (ref 150–450)
RBC: 4.71 x10E6/uL (ref 4.14–5.80)
RDW: 12.7 % (ref 11.6–15.4)
WBC: 9.4 10*3/uL (ref 3.4–10.8)

## 2021-07-10 LAB — TSH: TSH: 1.22 u[IU]/mL (ref 0.450–4.500)

## 2021-07-10 LAB — PSA: Prostate Specific Ag, Serum: 0.2 ng/mL (ref 0.0–4.0)

## 2021-07-10 LAB — VITAMIN D 25 HYDROXY (VIT D DEFICIENCY, FRACTURES): Vit D, 25-Hydroxy: 36.8 ng/mL (ref 30.0–100.0)

## 2021-07-11 LAB — SPECIMEN STATUS REPORT

## 2021-07-11 LAB — HGB A1C W/O EAG: Hgb A1c MFr Bld: 5.6 % (ref 4.8–5.6)

## 2021-08-15 ENCOUNTER — Telehealth: Payer: Self-pay | Admitting: Family Medicine

## 2021-08-15 NOTE — Telephone Encounter (Signed)
NA

## 2021-08-15 NOTE — Telephone Encounter (Signed)
PT REQUESTING IF A CANCELLATION HAPPENS BEFORE WEDNESDAY HE REQUESTS [place] CALL YOU ?

## 2021-08-15 NOTE — Telephone Encounter (Signed)
Spoke with patient, he does not feel like it it serious enough for him to go to the ER and will see you at his appointment next week  unless things worsens and he decides to go to ER. ?

## 2021-08-15 NOTE — Telephone Encounter (Signed)
Patient states he was having on and off chest pain for over a month maybe 3-4 days. States he believes it to be anxiety related offered first available with [employer] in the morning. Advised if it got worse for him to go to ER. Patient then stated who is B. Blanch Media I told him he stated he thought chest pain was an emergency and how was I going to push him on to a NP he only wanted to see "Ashly and he would wait" first avalible was next Wednesday he took it and stated he would try not to die by then. FYI ?

## 2021-08-15 NOTE — Telephone Encounter (Signed)
At last visit, he told me he had been stable from an anxiety standpoint.  I agree if he is having chest pain, ER is a good idea. ?

## 2021-08-21 ENCOUNTER — Ambulatory Visit (INDEPENDENT_AMBULATORY_CARE_PROVIDER_SITE_OTHER): Payer: 59 | Admitting: Family Medicine

## 2021-08-21 VITALS — BP 135/75 | HR 70 | Temp 97.4°F | Ht 70.0 in | Wt 180.6 lb

## 2021-08-21 DIAGNOSIS — F411 Generalized anxiety disorder: Secondary | ICD-10-CM

## 2021-08-21 DIAGNOSIS — R0789 Other chest pain: Secondary | ICD-10-CM | POA: Diagnosis not present

## 2021-08-21 DIAGNOSIS — E7849 Other hyperlipidemia: Secondary | ICD-10-CM | POA: Diagnosis not present

## 2021-08-21 DIAGNOSIS — R002 Palpitations: Secondary | ICD-10-CM | POA: Diagnosis not present

## 2021-08-21 MED ORDER — CITALOPRAM HYDROBROMIDE 20 MG PO TABS: 20.0000 mg | ORAL_TABLET | Freq: Every day | ORAL | 1 refills | Status: DC

## 2021-08-21 NOTE — Patient Instructions (Signed)
Message me in 1 month about how celexa is going. ?CT of the heart ordered.  You will be called to schedule ? ?

## 2021-08-21 NOTE — Progress Notes (Signed)
? ?Subjective: ?CC: Atypical chest pain ?PCP: Janora Norlander, DO ?Joseph Oliver is a 61 y.o. male presenting to clinic today for: ? ?1.  Atypical chest pain ?Patient reports that he had an episode of atypical chest pain that lasted a few hours last Tuesday.  It really felt like it was coming from the left posterior back.  He is not sure that this was really coming from his heart or other manifestation of panic and anxiety as prior to this episode he was thinking about his finances and really worried about where things were going.  He has had some symptoms of not feeling like he is as valuable as other folks.  He feels sometimes very deflated by this.  He has not discussed this with his girlfriend because he does not want to worry her.  He was previously treated with as needed alprazolam and is interested in starting something regularly to help with symptoms. ? ? ?ROS: Per HPI ? ?No Known Allergies ?Past Medical History:  ?Diagnosis Date  ? Anemia   ? Hx of   ? Arthritis   ? neck  ? GERD (gastroesophageal reflux disease)   ? Glaucoma   ? ocular hypertension-being watched.  ? Hyperlipemia   ? Vitamin D deficiency   ? ? ?Current Outpatient Medications:  ?  calcium-vitamin D (OSCAL WITH D) 500-200 MG-UNIT tablet, Take 1 tablet by mouth daily., Disp: , Rfl:  ?  Ferrous Sulfate (IRON) 325 (65 FE) MG TABS, Take 1 tablet by mouth daily., Disp: , Rfl:  ?  methocarbamol (ROBAXIN) 500 MG tablet, Take 1 tablet (500 mg total) by mouth every 6 (six) hours as needed for muscle spasms., Disp: 120 tablet, Rfl: 5 ?  Multiple Vitamins-Minerals (CENTRUM SILVER PO), Take 1 capsule by mouth daily., Disp: , Rfl:  ?  rosuvastatin (CRESTOR) 10 MG tablet, Take 1 tablet (10 mg total) by mouth daily., Disp: 90 tablet, Rfl: 3 ?Social History  ? ?Socioeconomic History  ? Marital status: Divorced  ?  Spouse name: Not on file  ? Number of children: 0  ? Years of education: Not on file  ? Highest education level: Not on file   ?Occupational History  ? Occupation: Programme researcher, broadcasting/film/video   ?Tobacco Use  ? Smoking status: Never  ? Smokeless tobacco: Never  ?Vaping Use  ? Vaping Use: Never used  ?Substance and Sexual Activity  ? Alcohol use: Yes  ?  Comment: occasional   ? Drug use: No  ? Sexual activity: Not on file  ?Other Topics Concern  ? Not on file  ?Social History Narrative  ? Not on file  ? ?Social Determinants of Health  ? ?Financial Resource Strain: Not on file  ?Food Insecurity: Not on file  ?Transportation Needs: Not on file  ?Physical Activity: Not on file  ?Stress: Not on file  ?Social Connections: Not on file  ?Intimate Partner Violence: Not on file  ? ?Family History  ?Problem Relation Age of Onset  ? Drug abuse Mother   ? Cancer Mother   ?     lung  ? Alzheimer's disease Mother   ? Colon cancer Neg Hx   ? Rectal cancer Neg Hx   ? Stomach cancer Neg Hx   ? Colon polyps Neg Hx   ? Esophageal cancer Neg Hx   ? ? ?Objective: ?Office vital signs reviewed. ?BP 135/75   Pulse 70   Temp (!) 97.4 ?F (36.3 ?C) (Temporal)   Ht '5\' 10"'$  (1.778 m)  Wt 180 lb 9.6 oz (81.9 kg)   SpO2 99%   BMI 25.91 kg/m?  ? ?Physical Examination:  ?General: Awake, alert, well nourished, No acute distress ?HEENT: Sclera white.  Moist mucous membranes ?Cardio: regular rate and rhythm, S1S2 heard, no murmurs appreciated ?Pulm: clear to auscultation bilaterally, no wheezes, rhonchi or rales; normal work of breathing on room air ?MSK: normal gait and station ?Psych: Mood stable, speech normal, affect appropriate.  Patient is very pleasant and interactive ?Depression screen [facility] 2/9 08/21/2021 07/02/2021 08/21/2020  ?Decreased Interest 1 0 0  ?Down, Depressed, Hopeless 1 0 0  ?PHQ - 2 Score 2 0 0  ?Altered sleeping 0 0 0  ?Tired, decreased energy 1 0 0  ?Change in appetite 0 0 0  ?Feeling bad or failure about yourself  0 0 0  ?Trouble concentrating 0 0 0  ?Moving slowly or fidgety/restless 0 0 0  ?Suicidal thoughts 0 0 0  ?PHQ-9 Score 3 0 0  ?Difficult doing work/chores Not  difficult at all Not difficult at all -  ? ?GAD 7 : Generalized Anxiety Score 08/21/2021 07/02/2021 08/21/2020 06/30/2018  ?Nervous, Anxious, on Edge 1 0 0 2  ?Control/stop worrying 0 0 0 3  ?Worry too much - different things 0 0 0 3  ?Trouble relaxing 1 0 1 3  ?Restless 0 0 1 0  ?Easily annoyed or irritable 0 0 1 1  ?Afraid - awful might happen 0 0 0 0  ?Total GAD 7 Score 2 0 3 12  ?Anxiety Difficulty - Not difficult at all Not difficult at all -  ? ?Assessment/ Plan: ?61 y.o. male  ? ?Generalized anxiety disorder - Plan: EKG 12-Lead, citalopram (CELEXA) 20 MG tablet ? ?Palpitations - Plan: EKG 12-Lead ? ?Atypical chest pain - Plan: CT CARDIAC SCORING (SELF PAY ONLY) ? ?Other hyperlipidemia - Plan: CT CARDIAC SCORING (SELF PAY ONLY) ? ?EKG was obtained just to evaluate this atypical chest pain I cannot appreciate any ischemic processes or arrhythmias on EKG. ? ?I am going to start him on Celexa.  He previously responded well to Lexapro but had a few unwanted side effects so I think he might benefit from an enantiomer.   ? ?However, he does have enough risk that I think it is worth him having a cardiac CT score for calcium calculation.  He would like to go ahead and proceed with this out-of-pocket and I have placed this order.  Further follow-up pending this result.  We discussed that this may lead to further referral to cardiology if noted to be elevated.  I think that he would like to have this done at least for peace of mind ? ?Orders Placed This Encounter  ?Procedures  ? EKG 12-Lead  ? ?No orders of the defined types were placed in this encounter. ? ? ? ?Janora Norlander, DO ?[place] ?[phone] ? ? ? ?

## 2021-08-30 ENCOUNTER — Other Ambulatory Visit: Payer: Self-pay | Admitting: Family Medicine

## 2021-08-30 DIAGNOSIS — M62838 Other muscle spasm: Secondary | ICD-10-CM

## 2021-08-30 DIAGNOSIS — M5030 Other cervical disc degeneration, unspecified cervical region: Secondary | ICD-10-CM

## 2021-10-01 ENCOUNTER — Ambulatory Visit (HOSPITAL_COMMUNITY): Admission: RE | Admit: 2021-10-01 | Payer: Self-pay | Source: Ambulatory Visit

## 2021-11-18 ENCOUNTER — Ambulatory Visit (HOSPITAL_COMMUNITY)
Admission: RE | Admit: 2021-11-18 | Discharge: 2021-11-18 | Disposition: A | Discharge status: Home / Self Care | Payer: 59 | Source: Ambulatory Visit | Attending: Family Medicine | Admitting: Family Medicine

## 2021-11-18 DIAGNOSIS — E7849 Other hyperlipidemia: Secondary | ICD-10-CM | POA: Diagnosis present

## 2021-11-18 DIAGNOSIS — R0789 Other chest pain: Secondary | ICD-10-CM | POA: Insufficient documentation

## 2021-11-21 ENCOUNTER — Other Ambulatory Visit: Payer: Self-pay | Admitting: Family Medicine

## 2021-11-21 DIAGNOSIS — F411 Generalized anxiety disorder: Secondary | ICD-10-CM

## 2021-11-25 ENCOUNTER — Encounter: Payer: Self-pay | Admitting: *Deleted

## 2022-02-05 ENCOUNTER — Ambulatory Visit (INDEPENDENT_AMBULATORY_CARE_PROVIDER_SITE_OTHER): Payer: 59 | Admitting: Family Medicine

## 2022-02-05 ENCOUNTER — Encounter: Payer: Self-pay | Admitting: Family Medicine

## 2022-02-05 ENCOUNTER — Ambulatory Visit (INDEPENDENT_AMBULATORY_CARE_PROVIDER_SITE_OTHER): Payer: 59

## 2022-02-05 VITALS — BP 119/68 | HR 85 | Temp 99.0°F | Resp 20 | Ht 70.0 in | Wt 179.0 lb

## 2022-02-05 DIAGNOSIS — M25552 Pain in left hip: Secondary | ICD-10-CM

## 2022-02-05 MED ORDER — METHYLPREDNISOLONE ACETATE 80 MG/ML IJ SUSP
80.0000 mg | Freq: Once | INTRAMUSCULAR | Status: AC
  Administered 2022-02-05: 80 mg via INTRA_ARTICULAR

## 2022-02-05 NOTE — Patient Instructions (Signed)
Hip Bursitis Rehab Ask your health care provider which exercises are safe for you. Do exercises exactly as told by your health care provider and adjust them as directed. It is normal to feel mild stretching, pulling, tightness, or discomfort as you do these exercises. Stop right away if you feel sudden pain or your pain gets worse. Do not begin these exercises until told by your health care provider. Stretching exercise This exercise warms up your muscles and joints and improves the movement and flexibility of your hip. This exercise also helps to relieve pain and stiffness. Iliotibial band stretch An iliotibial band is a strong band of muscle tissue that runs from the outer side of your hip to the outer side of your thigh and knee. Lie on your side with your left / right leg in the top position. Bend your left / right knee and grab your ankle. Stretch out your bottom arm to help you balance. Slowly bring your knee back so your thigh is slightly behind your body. Slowly lower your knee toward the floor until you feel a gentle stretch on the outside of your left / right thigh. If you do not feel a stretch and your knee will not lower more toward the floor, place the heel of your other foot on top of your knee and pull your knee down toward the floor with your foot. Hold this position for __________ seconds. Slowly return to the starting position. Repeat __________ times. Complete this exercise __________ times a day. Strengthening exercises These exercises build strength and endurance in your hip and pelvis. Endurance is the ability to use your muscles for a long time, even after they get tired. Bridge This exercise strengthens the muscles that move your thigh backward (hip extensors). Lie on your back on a firm surface with your knees bent and your feet flat on the floor. Tighten your buttocks muscles and lift your buttocks off the floor until your trunk is level with your thighs. Do not arch your  back. You should feel the muscles working in your buttocks and the back of your thighs. If you do not feel these muscles, slide your feet 1-2 inches (2.5-5 cm) farther away from your buttocks. If this exercise is too easy, try doing it with your arms crossed over your chest. Hold this position for __________ seconds. Slowly lower your hips to the starting position. Let your muscles relax completely after each repetition. Repeat __________ times. Complete this exercise __________ times a day. Squats This exercise strengthens the muscles in front of your thigh and knee (quadriceps). Stand in front of a table, with your feet and knees pointing straight ahead. You may rest your hands on the table for balance but not for support. Slowly bend your knees and lower your hips like you are going to sit in a chair. Keep your weight over your heels, not over your toes. Keep your lower legs upright so they are parallel with the table legs. Do not let your hips go lower than your knees. Do not bend lower than told by your health care provider. If your hip pain increases, do not bend as low. Hold the squat position for __________ seconds. Slowly push with your legs to return to standing. Do not use your hands to pull yourself to standing. Repeat __________ times. Complete this exercise __________ times a day. Hip hike  Stand sideways on a bottom step. Stand on your left / right leg with your other foot unsupported next to   the step. You can hold on to the railing or wall for balance if needed. Keep your knees straight and your torso square. Then lift your left / right hip up toward the ceiling. Hold this position for __________ seconds. Slowly let your left / right hip lower toward the floor, past the starting position. Your foot should get closer to the floor. Do not lean or bend your knees. Repeat __________ times. Complete this exercise __________ times a day. Single leg stand This exercise increases  your balance. Without shoes, stand near a railing or in a doorway. You may hold on to the railing or door frame as needed for balance. Squeeze your left / right buttock muscles, then lift up your other foot. Do not let your left / right hip push out to the side. It is helpful to stand in front of a mirror for this exercise so you can watch your hip. Hold this position for __________ seconds. Repeat __________ times. Complete this exercise __________ times a day. This information is not intended to replace advice given to you by your health care provider. Make sure you discuss any questions you have with your health care provider. Document Revised: 05/15/2021 Document Reviewed: 05/15/2021 [employer] Patient Education  2023 [employer]  

## 2022-02-05 NOTE — Progress Notes (Addendum)
Subjective: CC: Left hip pain PCP: Janora Norlander, DO RDE:[id number] Joseph Oliver is a 61 y.o. male presenting to clinic today for:  1.  Left hip pain Patient reports lateral left hip pain that is been present for months now.  No preceding injury.  He looked at his hip recently though and thought he saw a little bit of a bruise that he wanted to come in to have this evaluated.  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  He has not had any issues with range of motion or other issues with ambulation.  However, now he notes that when he rolls onto that left side or presses on that left side it does hurt.  No sensory changes or weakness reported.   ROS: Per HPI  No Known Allergies Past Medical History:  Diagnosis Date   Anemia    Hx of    Arthritis    neck   GERD (gastroesophageal reflux disease)    Glaucoma    ocular hypertension-being watched.   Hyperlipemia    Vitamin D deficiency     Current Outpatient Medications:    calcium-vitamin D (OSCAL WITH D) 500-200 MG-UNIT tablet, Take 1 tablet by mouth daily., Disp: , Rfl:    citalopram (CELEXA) 20 MG tablet, TAKE 1 TABLET BY MOUTH EVERY DAY, Disp: 90 tablet, Rfl: 3   Ferrous Sulfate (IRON) 325 (65 FE) MG TABS, Take 1 tablet by mouth daily., Disp: , Rfl:    methocarbamol (ROBAXIN) 500 MG tablet, Take 1 tablet (500 mg total) by mouth every 6 (six) hours as needed for muscle spasms., Disp: 120 tablet, Rfl: 5   Multiple Vitamins-Minerals (CENTRUM SILVER PO), Take 1 capsule by mouth daily., Disp: , Rfl:    rosuvastatin (CRESTOR) 10 MG tablet, Take 1 tablet (10 mg total) by mouth daily., Disp: 90 tablet, Rfl: 3   tiZANidine (ZANAFLEX) 4 MG tablet, TAKE 0.5-1 TABLETS (2-4 MG TOTAL) BY MOUTH EVERY 8 (EIGHT) HOURS AS NEEDED FOR MUSCLE SPASMS., Disp: 30 tablet, Rfl: 2 Social History   Socioeconomic History   Marital status: Divorced    Spouse name: Not on file   Number of children: 0   Years of  education: Not on file   Highest education level: Not on file  Occupational History   Occupation: Programme researcher, broadcasting/film/video   Tobacco Use   Smoking status: Never   Smokeless tobacco: Never  Vaping Use   Vaping Use: Never used  Substance and Sexual Activity   Alcohol use: Yes    Comment: occasional    Drug use: No   Sexual activity: Not on file  Other Topics Concern   Not on file  Social History Narrative   Not on file   Social Determinants of Health   Financial Resource Strain: Not on file  Food Insecurity: Not on file  Transportation Needs: Not on file  Physical Activity: Not on file  Stress: Not on file  Social Connections: Not on file  Intimate Partner Violence: Not on file   Family History  Problem Relation Age of Onset   Drug abuse Mother    Cancer Mother        lung   Alzheimer's disease Mother    Colon cancer Neg Hx    Rectal cancer Neg Hx    Stomach cancer Neg Hx    Colon polyps Neg Hx    Esophageal cancer Neg Hx     Objective: Office vital signs reviewed. BP 119/68  Pulse 85   Temp 99 F (37.2 C)   Resp 20   Ht '5\' 10"'$  (1.778 m)   Wt 179 lb (81.2 kg)   SpO2 98%   BMI 25.68 kg/m   Physical Examination:  General: Awake, alert, well nourished, No acute distress MSK: TTP over left trochanteric bursa. No ecchymosis, erythema, soft tissue swelling appreciated.  No palpable bony abnormalities.  No pain with FADIR or FABER  JOINT INJECTION:  Patient denies allergy to antiseptics (including iodine) and anesthetics.  Patient  h/o diabetes, frequent steroid use, use of blood thinners/ antiplatelets.  Patient was given informed consent and a signed copy has been placed in the chart. Appropriate time out was taken. Area prepped and draped in usual sterile fashion. Anatomic landmarks were identified and injection site was marked.  Ethyl chloride spray was used to numb the area and 1 cc of methylprednisolone 40 mg/ml plus  3 cc of 1% lidocaine without epinephrine was injected  into the left trochanteric bursa using a(n) lateral approach. The patient tolerated the procedure well and there were no immediate complications. Estimated blood loss is less than 1 cc.  Post procedure instructions were reviewed and handout outlining these instructions were provided to patient.  Assessment/ Plan: 61 y.o. male   Left hip pain - Plan: DG HIP UNILAT W OR W/O PELVIS 2-3 VIEWS LEFT, methylPREDNISolone acetate (DEPO-MEDROL) injection 80 mg  X-rays were obtained for completion but no significant findings there.  He has some mild osteoarthritic changes appreciated.  With history of previous hip deformity I am not surprised that this is here but I certainly do not think this is the cause of his symptoms.  His physical exam was consistent with a trochanteric bursitis and this was treated with corticosteroid injection today.  Home care instructions reviewed and reasons for reevaluation discussed.  Home rehab provided to the patient to try and strengthen the hip so this does not recur.  He will contact me if symptoms do not improve  No orders of the defined types were placed in this encounter.  No orders of the defined types were placed in this encounter.    Janora Norlander, DO [place] [phone]

## 2022-06-25 ENCOUNTER — Telehealth: Payer: Self-pay | Admitting: Family Medicine

## 2022-06-25 DIAGNOSIS — F411 Generalized anxiety disorder: Secondary | ICD-10-CM

## 2022-06-25 NOTE — Telephone Encounter (Signed)
Ok to send but he shouldn't be out.  I sent in a year supply 11/2021.  He really shouldn't have to be seen until June.  Can you check with pharmacy on this?

## 2022-06-25 NOTE — Telephone Encounter (Signed)
Patient aware and states he has enough mediation.

## 2022-06-25 NOTE — Telephone Encounter (Signed)
Pt wants to know if PCP can send in enough medicine to last him until his appt that's scheduled on 07/04/22?

## 2022-07-04 ENCOUNTER — Encounter: Payer: Self-pay | Admitting: Family Medicine

## 2022-07-04 ENCOUNTER — Ambulatory Visit (INDEPENDENT_AMBULATORY_CARE_PROVIDER_SITE_OTHER): Payer: 59 | Admitting: Family Medicine

## 2022-07-04 VITALS — BP 116/72 | HR 72 | Temp 98.5°F | Ht 70.0 in | Wt 185.0 lb

## 2022-07-04 DIAGNOSIS — E559 Vitamin D deficiency, unspecified: Secondary | ICD-10-CM

## 2022-07-04 DIAGNOSIS — Z125 Encounter for screening for malignant neoplasm of prostate: Secondary | ICD-10-CM | POA: Diagnosis not present

## 2022-07-04 DIAGNOSIS — Z0001 Encounter for general adult medical examination with abnormal findings: Secondary | ICD-10-CM

## 2022-07-04 DIAGNOSIS — R69 Illness, unspecified: Secondary | ICD-10-CM | POA: Diagnosis not present

## 2022-07-04 DIAGNOSIS — F411 Generalized anxiety disorder: Secondary | ICD-10-CM | POA: Diagnosis not present

## 2022-07-04 DIAGNOSIS — D508 Other iron deficiency anemias: Secondary | ICD-10-CM | POA: Diagnosis not present

## 2022-07-04 DIAGNOSIS — E7849 Other hyperlipidemia: Secondary | ICD-10-CM | POA: Diagnosis not present

## 2022-07-04 DIAGNOSIS — R739 Hyperglycemia, unspecified: Secondary | ICD-10-CM

## 2022-07-04 DIAGNOSIS — Z23 Encounter for immunization: Secondary | ICD-10-CM

## 2022-07-04 DIAGNOSIS — Z0000 Encounter for general adult medical examination without abnormal findings: Secondary | ICD-10-CM

## 2022-07-04 MED ORDER — ROSUVASTATIN CALCIUM 10 MG PO TABS: 10.0000 mg | ORAL_TABLET | Freq: Every day | ORAL | 3 refills | Status: DC

## 2022-07-04 MED ORDER — METHOCARBAMOL 500 MG PO TABS: 500.0000 mg | ORAL_TABLET | Freq: Four times a day (QID) | ORAL | 99 refills | Status: DC | PRN

## 2022-07-04 MED ORDER — CITALOPRAM HYDROBROMIDE 20 MG PO TABS: 20.0000 mg | ORAL_TABLET | Freq: Every day | ORAL | 3 refills | Status: DC

## 2022-07-04 NOTE — Progress Notes (Signed)
Joseph Oliver is a 62 y.o. male presents to office today for annual physical exam examination.    Concerns today include: 1.  No concerns today  Occupation: Self-employed, Marital status: divorced, Substance use: None Diet: Typical american, Exercise: active Last colonoscopy: 08/2020 Refills needed today: All Immunizations needed: Shingles/ Flu Immunization History  Administered Date(s) Administered   Hepatitis A 03/03/1997, 01/23/2000   Influenza,inj,Quad PF,6+ Mos 07/18/2013, 06/12/2014, 06/30/2018, 07/02/2021   Td 08/21/2020   Tdap 04/15/2011   Zoster Recombinat (Shingrix) 05/11/2019     Past Medical History:  Diagnosis Date   Anemia    Hx of    Arthritis    neck   GERD (gastroesophageal reflux disease)    Glaucoma    ocular hypertension-being watched.   Hyperlipemia    Vitamin D deficiency    Social History   Socioeconomic History   Marital status: Divorced    Spouse name: Not on file   Number of children: 0   Years of education: Not on file   Highest education level: Not on file  Occupational History   Occupation: Executive   Tobacco Use   Smoking status: Never   Smokeless tobacco: Never  Vaping Use   Vaping Use: Never used  Substance and Sexual Activity   Alcohol use: Yes    Comment: occasional    Drug use: No   Sexual activity: Not on file  Other Topics Concern   Not on file  Social History Narrative   Not on file   Social Determinants of Health   Financial Resource Strain: Not on file  Food Insecurity: Not on file  Transportation Needs: Not on file  Physical Activity: Not on file  Stress: Not on file  Social Connections: Not on file  Intimate Partner Violence: Not on file   Past Surgical History:  Procedure Laterality Date   COLONOSCOPY  2016   HAND SURGERY     Left    NECK SURGERY     RHINOPLASTY     SPINAL FUSION     Family History  Problem Relation Age of Onset   Drug abuse Mother    Cancer Mother        lung    Alzheimer's disease Mother    Colon cancer Neg Hx    Rectal cancer Neg Hx    Stomach cancer Neg Hx    Colon polyps Neg Hx    Esophageal cancer Neg Hx     Current Outpatient Medications:    calcium-vitamin D (OSCAL WITH D) 500-200 MG-UNIT tablet, Take 1 tablet by mouth daily., Disp: , Rfl:    citalopram (CELEXA) 20 MG tablet, TAKE 1 TABLET BY MOUTH EVERY DAY, Disp: 90 tablet, Rfl: 3   Ferrous Sulfate (IRON) 325 (65 FE) MG TABS, Take 1 tablet by mouth daily., Disp: , Rfl:    methocarbamol (ROBAXIN) 500 MG tablet, Take 1 tablet (500 mg total) by mouth every 6 (six) hours as needed for muscle spasms., Disp: 120 tablet, Rfl: 5   Multiple Vitamins-Minerals (CENTRUM SILVER PO), Take 1 capsule by mouth daily., Disp: , Rfl:    rosuvastatin (CRESTOR) 10 MG tablet, Take 1 tablet (10 mg total) by mouth daily., Disp: 90 tablet, Rfl: 3  No Known Allergies   ROS: Review of Systems Pertinent items noted in HPI and remainder of comprehensive ROS otherwise negative.    Physical exam BP 116/72   Pulse 72   Temp 98.5 F (36.9 C)   Ht '5\' 10"'$  (1.778  m)   Wt 185 lb (83.9 kg)   SpO2 99%   BMI 26.54 kg/m  General appearance: alert, cooperative, appears stated age, and no distress Head: Normocephalic, without obvious abnormality, atraumatic Eyes: negative findings: lids and lashes normal, conjunctivae and sclerae normal, corneas clear, and pupils equal, round, reactive to light and accomodation Ears: normal TM's and external ear canals both ears Nose: Nares normal. Septum midline. Mucosa normal. No drainage or sinus tenderness. Throat: lips, mucosa, and tongue normal; teeth and gums normal Neck: no adenopathy, no carotid bruit, supple, symmetrical, trachea midline, and thyroid not enlarged, symmetric, no tenderness/mass/nodules Back: symmetric, no curvature. ROM normal. No CVA tenderness. Lungs: clear to auscultation bilaterally Chest wall: no tenderness Heart: regular rate and rhythm, S1, S2 normal,  no murmur, click, rub or gallop Abdomen: soft, non-tender; bowel sounds normal; no masses,  no organomegaly Extremities: extremities normal, atraumatic, no cyanosis or edema Pulses: 2+ and symmetric Skin: Skin color, texture, turgor normal. No rashes or lesions Lymph nodes: Cervical, supraclavicular, and axillary nodes normal. Neurologic: Grossly normal Psych: Mood stable, speech normal, affect appropriate.  Very pleasant and interactive     07/04/2022   11:19 AM 02/05/2022   12:06 PM 08/21/2021    3:58 PM  Depression screen PHQ 2/9  Decreased Interest 0 0 1  Down, Depressed, Hopeless 0 0 1  PHQ - 2 Score 0 0 2  Altered sleeping 0  0  Tired, decreased energy 0  1  Change in appetite 0  0  Feeling bad or failure about yourself  0  0  Trouble concentrating 0  0  Moving slowly or fidgety/restless 0  0  Suicidal thoughts 0  0  PHQ-9 Score 0  3  Difficult doing work/chores Not difficult at all  Not difficult at all      07/04/2022   11:20 AM 08/21/2021    3:58 PM 07/02/2021    3:18 PM 08/21/2020   10:13 AM  GAD 7 : Generalized Anxiety Score  Nervous, Anxious, on Edge 0 1 0 0  Control/stop worrying 0 0 0 0  Worry too much - different things 0 0 0 0  Trouble relaxing 0 1 0 1  Restless 0 0 0 1  Easily annoyed or irritable 0 0 0 1  Afraid - awful might happen 0 0 0 0  Total GAD 7 Score 0 2 0 3  Anxiety Difficulty Not difficult at all  Not difficult at all Not difficult at all   Assessment/ Plan: Joseph Oliver here for annual physical exam.   Annual physical exam  Generalized anxiety disorder - Plan: citalopram (CELEXA) 20 MG tablet  Other hyperlipidemia - Plan: rosuvastatin (CRESTOR) 10 MG tablet, CMP14+EGFR, Lipid Panel, TSH, CANCELED: CMP14+EGFR, CANCELED: Lipid Panel, CANCELED: TSH  Vitamin D deficiency - Plan: VITAMIN D 25 Hydroxy (Vit-D Deficiency, Fractures), CANCELED: VITAMIN D 25 Hydroxy (Vit-D Deficiency, Fractures)  Other iron deficiency anemia - Plan: CBC,  CANCELED: CBC  Elevated serum glucose - Plan: Bayer DCA Hb A1c Waived, CANCELED: Bayer DCA Hb A1c Waived  Screening for malignant neoplasm of prostate - Plan: PSA, CANCELED: PSA  Need for immunization against influenza - Plan: Flu Vaccine QUAD 27moIM (Fluarix, Fluzone & Alfiuria Quad PF)  Anxiety disorder is chronic and stable.  Celexa renewed  Fasting labs ordered.  Continue statin  Check vitamin D level given history of vitamin D deficiency  Check CBC given history of anemia  Check A1c given persistently elevated fasting blood sugar on  CMP  Check PSA.  Asymptomatic  Influenza vaccination administered  Counseled on healthy lifestyle choices, including diet (rich in fruits, vegetables and lean meats and low in salt and simple carbohydrates) and exercise (at least 30 minutes of moderate physical activity daily).  Patient to follow up in 1 year for annual exam or sooner if needed.  Joseph Uhde M. Lajuana Ripple, DO

## 2022-08-13 ENCOUNTER — Other Ambulatory Visit: Payer: 59

## 2022-08-13 DIAGNOSIS — E7849 Other hyperlipidemia: Secondary | ICD-10-CM | POA: Diagnosis not present

## 2022-08-13 DIAGNOSIS — R739 Hyperglycemia, unspecified: Secondary | ICD-10-CM

## 2022-08-13 DIAGNOSIS — Z125 Encounter for screening for malignant neoplasm of prostate: Secondary | ICD-10-CM | POA: Diagnosis not present

## 2022-08-13 DIAGNOSIS — D508 Other iron deficiency anemias: Secondary | ICD-10-CM

## 2022-08-13 DIAGNOSIS — E559 Vitamin D deficiency, unspecified: Secondary | ICD-10-CM

## 2022-08-13 LAB — BAYER DCA HB A1C WAIVED: HB A1C (BAYER DCA - WAIVED): 6 % — ABNORMAL HIGH (ref 4.8–5.6)

## 2022-08-14 LAB — CMP14+EGFR
ALT: 15 IU/L (ref 0–44)
AST: 17 IU/L (ref 0–40)
Albumin/Globulin Ratio: 2.3 — ABNORMAL HIGH (ref 1.2–2.2)
Albumin: 4.3 g/dL (ref 3.9–4.9)
Alkaline Phosphatase: 73 IU/L (ref 44–121)
BUN/Creatinine Ratio: 18 (ref 10–24)
BUN: 17 mg/dL (ref 8–27)
Bilirubin Total: 0.3 mg/dL (ref 0.0–1.2)
CO2: 24 mmol/L (ref 20–29)
Calcium: 9.2 mg/dL (ref 8.6–10.2)
Chloride: 103 mmol/L (ref 96–106)
Creatinine, Ser: 0.94 mg/dL (ref 0.76–1.27)
Globulin, Total: 1.9 g/dL (ref 1.5–4.5)
Glucose: 98 mg/dL (ref 70–99)
Potassium: 4.4 mmol/L (ref 3.5–5.2)
Sodium: 141 mmol/L (ref 134–144)
Total Protein: 6.2 g/dL (ref 6.0–8.5)
eGFR: 92 mL/min/{1.73_m2} (ref >59)

## 2022-08-14 LAB — CBC
Hematocrit: 44.7 % (ref 37.5–51.0)
Hemoglobin: 14.5 g/dL (ref 13.0–17.7)
MCH: 29.2 pg (ref 26.6–33.0)
MCHC: 32.4 g/dL (ref 31.5–35.7)
MCV: 90 fL (ref 79–97)
Platelets: 293 10*3/uL (ref 150–450)
RBC: 4.96 x10E6/uL (ref 4.14–5.80)
RDW: 12.8 % (ref 11.6–15.4)
WBC: 8.7 10*3/uL (ref 3.4–10.8)

## 2022-08-14 LAB — LIPID PANEL
Chol/HDL Ratio: 2.2 ratio (ref 0.0–5.0)
Cholesterol, Total: 141 mg/dL (ref 100–199)
HDL: 65 mg/dL (ref >39)
LDL Chol Calc (NIH): 64 mg/dL (ref 0–99)
Triglycerides: 56 mg/dL (ref 0–149)
VLDL Cholesterol Cal: 12 mg/dL (ref 5–40)

## 2022-08-14 LAB — TSH: TSH: 1.07 u[IU]/mL (ref 0.450–4.500)

## 2022-08-14 LAB — VITAMIN D 25 HYDROXY (VIT D DEFICIENCY, FRACTURES): Vit D, 25-Hydroxy: 28.9 ng/mL — ABNORMAL LOW (ref 30.0–100.0)

## 2022-08-14 LAB — PSA: Prostate Specific Ag, Serum: 0.2 ng/mL (ref 0.0–4.0)

## 2022-09-01 ENCOUNTER — Telehealth: Payer: Self-pay | Admitting: Family Medicine

## 2022-09-08 ENCOUNTER — Ambulatory Visit: Payer: 59 | Admitting: Family Medicine

## 2022-09-08 ENCOUNTER — Encounter: Payer: Self-pay | Admitting: Family Medicine

## 2022-09-08 VITALS — BP 108/64 | HR 74 | Temp 98.6°F | Ht 70.0 in | Wt 182.0 lb

## 2022-09-08 DIAGNOSIS — X503XXA Overexertion from repetitive movements, initial encounter: Secondary | ICD-10-CM

## 2022-09-08 DIAGNOSIS — M79671 Pain in right foot: Secondary | ICD-10-CM

## 2022-09-08 NOTE — Progress Notes (Signed)
Subjective: CC: Foot injury PCP: Janora Norlander, DO [id number] Joseph Oliver is a 62 y.o. male presenting to clinic today for:  1.  Foot injury Patient reports about a 1 month history of a right-sided foot pain.  He points to the third metatarsal shaft as the area of pain.  Pain seems to be worse with pushing off.  He was wearing his converses whilst working and wonders if perhaps this caused.  He started wearing a more rigid shoe and the pain seems to be slightly better.  No sensory changes or direct injury to the foot reported.   ROS: Per HPI  No Known Allergies Past Medical History:  Diagnosis Date   Anemia    Hx of    Arthritis    neck   GERD (gastroesophageal reflux disease)    Glaucoma    ocular hypertension-being watched.   Hyperlipemia    Vitamin D deficiency     Current Outpatient Medications:    calcium-vitamin D (OSCAL WITH D) 500-200 MG-UNIT tablet, Take 1 tablet by mouth daily., Disp: , Rfl:    citalopram (CELEXA) 20 MG tablet, Take 1 tablet (20 mg total) by mouth daily., Disp: 90 tablet, Rfl: 3   Ferrous Sulfate (IRON) 325 (65 FE) MG TABS, Take 1 tablet by mouth daily., Disp: , Rfl:    methocarbamol (ROBAXIN) 500 MG tablet, Take 1 tablet (500 mg total) by mouth every 6 (six) hours as needed for muscle spasms., Disp: 120 tablet, Rfl: PRN   Multiple Vitamins-Minerals (CENTRUM SILVER PO), Take 1 capsule by mouth daily., Disp: , Rfl:    rosuvastatin (CRESTOR) 10 MG tablet, Take 1 tablet (10 mg total) by mouth daily., Disp: 90 tablet, Rfl: 3 Social History   Socioeconomic History   Marital status: Divorced    Spouse name: Not on file   Number of children: 0   Years of education: Not on file   Highest education level: Not on file  Occupational History   Occupation: Executive   Tobacco Use   Smoking status: Never   Smokeless tobacco: Never  Vaping Use   Vaping Use: Never used  Substance and Sexual Activity   Alcohol use: Yes    Comment: occasional     Drug use: No   Sexual activity: Not on file  Other Topics Concern   Not on file  Social History Narrative   Not on file   Social Determinants of Health   Financial Resource Strain: Not on file  Food Insecurity: Not on file  Transportation Needs: Not on file  Physical Activity: Not on file  Stress: Not on file  Social Connections: Not on file  Intimate Partner Violence: Not on file   Family History  Problem Relation Age of Onset   Drug abuse Mother    Cancer Mother        lung   Alzheimer's disease Mother    Colon cancer Neg Hx    Rectal cancer Neg Hx    Stomach cancer Neg Hx    Colon polyps Neg Hx    Esophageal cancer Neg Hx     Objective: Office vital signs reviewed. BP 108/64   Pulse 74   Temp 98.6 F (37 C)   Ht 5\' 10"  (1.778 m)   Wt 182 lb (82.6 kg)   SpO2 96%   BMI 26.11 kg/m   Physical Examination:  General: Awake, alert, well nourished, No acute distress MSK: Slightly antalgic gait with normal station.  Right foot: He  has no tenderness to palpation.  Pain with pushoff noted in that right foot.  He has full active range of motion.  No soft tissue swelling, erythema or warmth.  No palpable bony defects or abnormalities  Assessment/ Plan: 62 y.o. male   Repetitive stress injury  ?  Stress fracture.  Offered x-ray but wanted to hold off on this since it would not really change treatment at this time.  He will get a postop shoe.  Should need somewhere between the medium and large.  He elected to order this offline since it is less expensive.  Okay to use topical analgesic of choice or oral analgesic if needed.  Discussed need for postop shoe for at least the next 2 to 3 weeks if he can be compliant with that.  If no improvement, could consider referral to sports medicine for direct visualization of this area under ultrasound.  He is hesitant to see an orthopedist due to a bad experience with a hand injury previously  No orders of the defined types were placed  in this encounter.  No orders of the defined types were placed in this encounter.    Janora Norlander, DO [place] [phone]

## 2023-03-27 IMAGING — CT CT CARDIAC CORONARY ARTERY CALCIUM SCORE
2 of 3 series · 6 of 20 positions shown, 7 images · non-contrast
Comparison: None Available.
COMPARISON: None Available.

Addendum:
EXAM:
OVER-READ INTERPRETATION  CT CHEST

The following report is an over-read performed by radiologist Dr.
Nya Jumper [[email or web address]] on 11/18/2021. This over-read
does not include interpretation of cardiac or coronary anatomy or
pathology. The coronary calcium score interpretation by the
cardiologist is attached.
CLINICAL DATA: Cardiovascular disease risk stratification
CAD screening, intermediate CAD risk, treadmill candidate
CT Coronary Calcium Score
TECHNIQUE: A gated, non-contrast computed tomography scan of the heart was
performed using 3mm slice thickness. Axial images were analyzed on a
dedicated workstation. Calcium scoring of the coronary arteries was
performed using the Agatston method.

[Series 2: 2 ct hrt calcium · axial · 0.69mm/px · z∈[+1397,+1465]mm · 3 of 136 slices shown, 4 images]
[im 34/136  vessel]
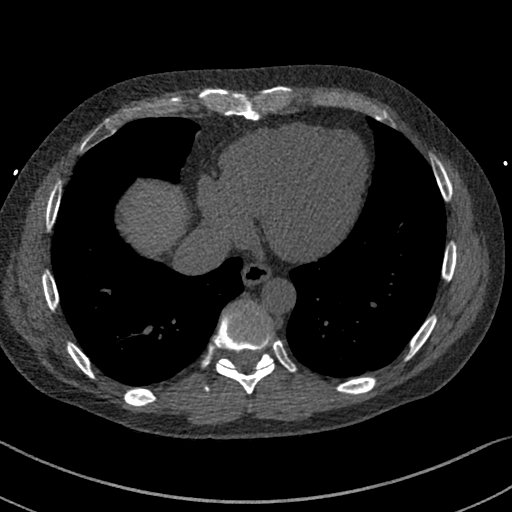
[im 34/136  lung]
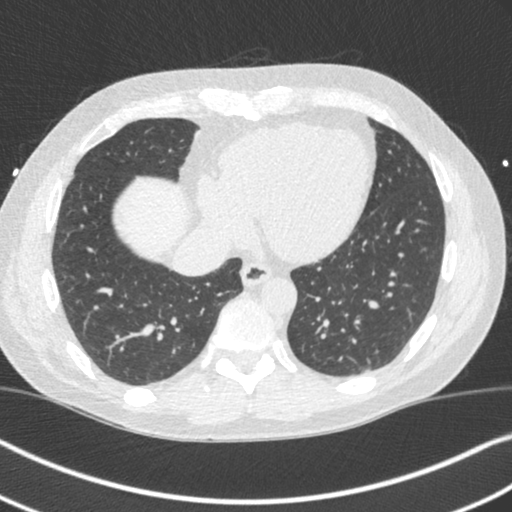
[im 68/136  vessel]
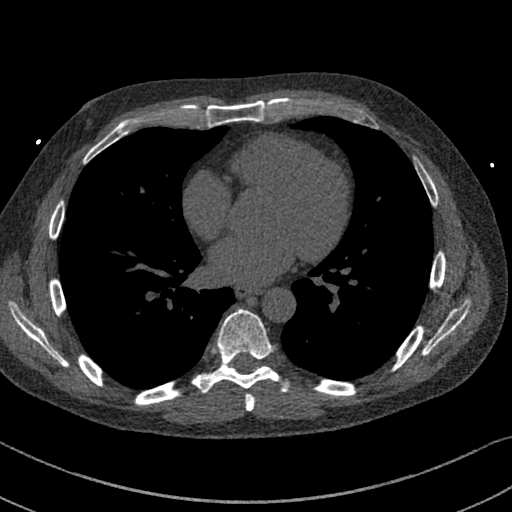
[im 102/136  vessel]
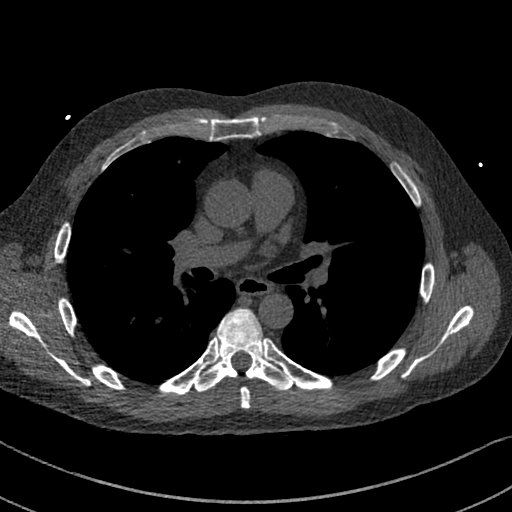

[Series 3: 2 soft full fov · axial · 0.84mm/px · z∈[+1397,+1465]mm · 3 of 136 slices shown]
[im 34/136  vessel]
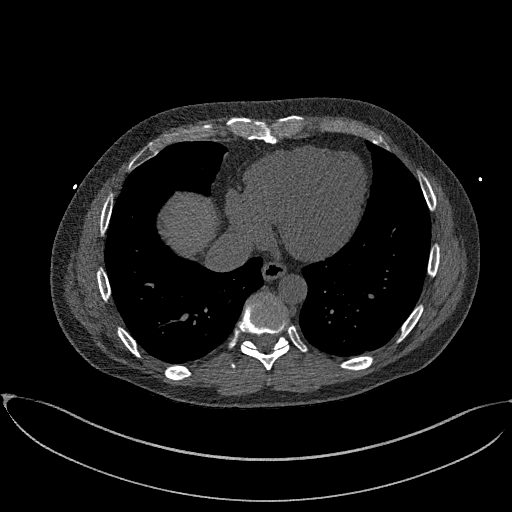
[im 68/136  vessel]
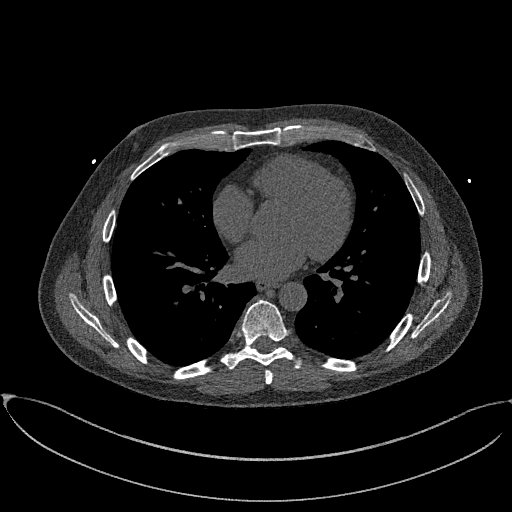
[im 102/136  vessel]
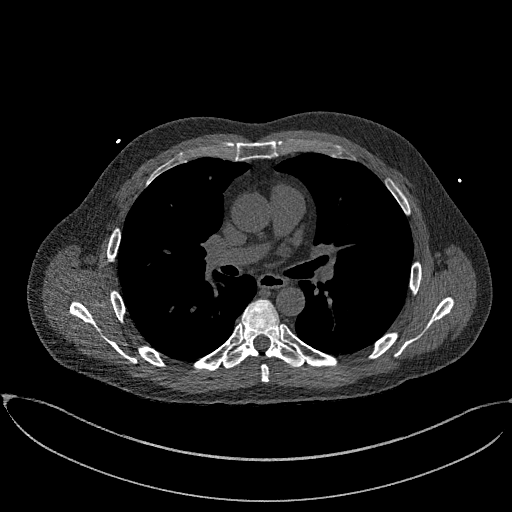

[6 of 20 positions shown; findings below may reference images not displayed]

FINDINGS: Vascular: The heart is normal in size. No pericardial effusion.
Coronary atherosclerotic calcifications. The visualized thoracic
aorta is normal in size devoid of calcified atherosclerotic changes.

Mediastinum/Nodes: No enlarged mediastinal or axillary lymph nodes.
Thyroid gland, trachea, and esophagus demonstrate no significant
findings.

Lungs/Pleura: Punctate calcified granuloma in the right lower lobe.
No suspicious pulmonary nodules. No evidence of pleural effusion or
pneumothorax.

Upper Abdomen: 0.8 cm hypoattenuating lesion in the left lobe of the
liver, favored represent simple hepatic cyst, though too small to
characterize by CT.

Musculoskeletal: No chest wall mass or suspicious bone lesions
identified.
IMPRESSION: 1. Evidence of prior granulomatous disease.
2. Subcentimeter hypoattenuating lesion in the left lobe of the
liver, favored represent simple hepatic cyst, though too small to
characterize by CT.
FINDINGS: Coronary Calcium Score:

Left main: 0

Left anterior descending artery: 128

Left circumflex artery: 0

Right coronary artery:

Total: 128

Percentile: 69th

Pericardium: Normal.

Ascending Aorta: Normal caliber. Ascending aorta measures
approximately 33mm at the mid ascending aorta measured in an axial
plane.

Non-cardiac: See separate report from [REDACTED].
IMPRESSION: Coronary calcium score of 128. This was 69th percentile for age-,
race-, and sex-matched controls.



If CAC=0, it is reasonable to withhold statin therapy and reassess
in 5 to 10 years, as long as higher risk conditions are absent
(diabetes mellitus, family history of premature CHD in first degree
relatives (males <55 years; females <65 years), cigarette smoking,
or LDL >=190 mg/dL).

If CAC is 1 to 99, it is reasonable to initiate statin therapy for
patients >=55 years of age.

If CAC is >=100 or >=75th percentile, it is reasonable to initiate
statin therapy at any age.

Cardiology referral should be considered for patients with CAC
scores >=400 or >=75th percentile.

*2812 AHA/ACC/AACVPR/AAPA/ABC/DEFALCO/TEQUIDA/BONGFEN/Jhoel Alexander/WILLIAM RAUL/ARCILA/GARAJ
Guideline on the Management of Blood Cholesterol: A Report of the
American College of Cardiology/American Heart Association Task Force
on Clinical Practice Guidelines. J Am Coll Cardiol.
7636;73(24):6479-6837.

*** End of Addendum ***
EXAM:
OVER-READ INTERPRETATION  CT CHEST

The following report is an over-read performed by radiologist Dr.
Nya Jumper [REDACTED] on 11/18/2021. This over-read
does not include interpretation of cardiac or coronary anatomy or
pathology. The coronary calcium score interpretation by the
cardiologist is attached.
FINDINGS: Vascular: The heart is normal in size. No pericardial effusion.
Coronary atherosclerotic calcifications. The visualized thoracic
aorta is normal in size devoid of calcified atherosclerotic changes.

Mediastinum/Nodes: No enlarged mediastinal or axillary lymph nodes.
Thyroid gland, trachea, and esophagus demonstrate no significant
findings.

Lungs/Pleura: Punctate calcified granuloma in the right lower lobe.
No suspicious pulmonary nodules. No evidence of pleural effusion or
pneumothorax.

Upper Abdomen: 0.8 cm hypoattenuating lesion in the left lobe of the
liver, favored represent simple hepatic cyst, though too small to
characterize by CT.

Musculoskeletal: No chest wall mass or suspicious bone lesions
identified.
IMPRESSION: 1. Evidence of prior granulomatous disease.
2. Subcentimeter hypoattenuating lesion in the left lobe of the
liver, favored represent simple hepatic cyst, though too small to
characterize by CT.

## 2023-07-27 ENCOUNTER — Other Ambulatory Visit: Payer: Self-pay | Admitting: Family Medicine

## 2023-07-27 ENCOUNTER — Encounter: Payer: Self-pay | Admitting: Family Medicine

## 2023-07-27 DIAGNOSIS — E7849 Other hyperlipidemia: Secondary | ICD-10-CM

## 2023-07-27 DIAGNOSIS — F411 Generalized anxiety disorder: Secondary | ICD-10-CM

## 2023-07-27 NOTE — Telephone Encounter (Signed)
 Letter sent.

## 2023-07-27 NOTE — Telephone Encounter (Signed)
 [facility] NTBS last OV 07/04/22 NO RF sent to pharmacy last OV greater than a year

## 2023-07-29 ENCOUNTER — Other Ambulatory Visit: Payer: Self-pay | Admitting: Family Medicine

## 2023-07-29 DIAGNOSIS — E7849 Other hyperlipidemia: Secondary | ICD-10-CM

## 2023-07-29 MED ORDER — ROSUVASTATIN CALCIUM 10 MG PO TABS: 10.0000 mg | ORAL_TABLET | Freq: Every day | ORAL | 0 refills | Status: DC

## 2023-07-29 NOTE — Telephone Encounter (Signed)
  Prescription Request  07/29/2023  Is this a "Controlled Substance" medicine? NO  Have you seen your PCP in the last 2 weeks? PT SCHEDULED FIRST AVAILABLE ON 08/26/23  If YES, route message to pool  -  If NO, patient needs to be scheduled for appointment.  What is the name of the medication or equipment? ROSUVASTATIN  Have you contacted your pharmacy to request a refill?YES  Which pharmacy would you like this sent to? [facility]   Patient notified that their request is being sent to the clinical staff for review and that they should receive a response within 2 business days.

## 2023-07-29 NOTE — Telephone Encounter (Signed)
Pt aware refill sent to pharmacy

## 2023-08-06 ENCOUNTER — Other Ambulatory Visit: Payer: Self-pay | Admitting: Family Medicine

## 2023-08-06 NOTE — Telephone Encounter (Signed)
 Last OV 09/08/22. Last RF 07/04/22. Next OV 08/26/23

## 2023-08-26 ENCOUNTER — Ambulatory Visit: Payer: 59 | Admitting: Family Medicine

## 2023-08-26 ENCOUNTER — Encounter: Payer: Self-pay | Admitting: Family Medicine

## 2023-08-26 VITALS — BP 123/76 | HR 78 | Temp 98.7°F | Wt 182.0 lb

## 2023-08-26 DIAGNOSIS — E7849 Other hyperlipidemia: Secondary | ICD-10-CM | POA: Diagnosis not present

## 2023-08-26 DIAGNOSIS — D508 Other iron deficiency anemias: Secondary | ICD-10-CM

## 2023-08-26 DIAGNOSIS — R7303 Prediabetes: Secondary | ICD-10-CM | POA: Diagnosis not present

## 2023-08-26 DIAGNOSIS — Z0000 Encounter for general adult medical examination without abnormal findings: Secondary | ICD-10-CM

## 2023-08-26 DIAGNOSIS — Z23 Encounter for immunization: Secondary | ICD-10-CM | POA: Diagnosis not present

## 2023-08-26 DIAGNOSIS — F411 Generalized anxiety disorder: Secondary | ICD-10-CM | POA: Diagnosis not present

## 2023-08-26 DIAGNOSIS — Z0001 Encounter for general adult medical examination with abnormal findings: Secondary | ICD-10-CM

## 2023-08-26 DIAGNOSIS — M542 Cervicalgia: Secondary | ICD-10-CM

## 2023-08-26 DIAGNOSIS — E559 Vitamin D deficiency, unspecified: Secondary | ICD-10-CM

## 2023-08-26 DIAGNOSIS — Z125 Encounter for screening for malignant neoplasm of prostate: Secondary | ICD-10-CM | POA: Diagnosis not present

## 2023-08-26 LAB — BAYER DCA HB A1C WAIVED: HB A1C (BAYER DCA - WAIVED): 5.2 % (ref 4.8–5.6)

## 2023-08-26 MED ORDER — CITALOPRAM HYDROBROMIDE 20 MG PO TABS: 20.0000 mg | ORAL_TABLET | Freq: Every day | ORAL | 4 refills | Status: AC

## 2023-08-26 MED ORDER — ROSUVASTATIN CALCIUM 10 MG PO TABS: 10.0000 mg | ORAL_TABLET | Freq: Every day | ORAL | 4 refills | Status: AC

## 2023-08-26 MED ORDER — TIZANIDINE HCL 4 MG PO TABS: 4.0000 mg | ORAL_TABLET | Freq: Three times a day (TID) | ORAL | 0 refills | Status: AC | PRN

## 2023-08-26 NOTE — Progress Notes (Signed)
 Joseph Oliver is a 63 y.o. male presents to office today for annual physical exam examination.    Concerns today include: 1.  None.  He has been doing fairly well.  Occupation: Event organiser, Marital status: Has a girlfriend, Substance use: None There are no preventive care reminders to display for this patient.  Refills needed today: All  Immunization History  Administered Date(s) Administered   Hepatitis A 03/03/1997, 01/23/2000   Influenza,inj,Quad PF,6+ Mos 07/18/2013, 06/12/2014, 06/30/2018, 07/02/2021, 07/04/2022   Td 08/21/2020   Tdap 04/15/2011   Zoster Recombinant(Shingrix) 05/11/2019   Past Medical History:  Diagnosis Date   Anemia    Hx of    Arthritis    neck   GERD (gastroesophageal reflux disease)    Glaucoma    ocular hypertension-being watched.   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   Active   Social Drivers of Health   Financial Resource Strain: Low Risk  (08/26/2023)   Overall Financial Resource Strain (CARDIA)    Difficulty of Paying Living Expenses: Not hard at all  Food Insecurity: No Food Insecurity (08/26/2023)   Hunger Vital Sign    Worried About Running Out of Food in the Last Year: Never true    Ran Out of Food in the Last Year: Never true  Transportation Needs: No Transportation Needs (08/26/2023)   PRAPARE - Scientist, research (physical sciences) (Medical): No    Lack of Transportation (Non-Medical): No  Physical Activity: Sufficiently Active (08/26/2023)   Exercise Vital Sign    Days of Exercise per Week: 7 days    Minutes of Exercise per Session: 50 min  Stress: No Stress Concern Present (08/26/2023)   [employer] of Occupational Health - Occupational Stress Questionnaire    Feeling of Stress : Not at all  Social Connections: Moderately Integrated (08/26/2023)   Social Connection and Isolation Panel [NHANES]    Frequency of Communication with Friends and Family: Three times a week    Frequency of Social Gatherings with Friends and Family: Three times a week    Attends Religious Services: More than 4 times per year    Active Member of Clubs or Organizations: Yes    Attends Banker Meetings: More than 4 times per year    Marital Status: Separated  Intimate Partner Violence: Not At Risk (08/26/2023)   Humiliation, Afraid, Rape, and Kick questionnaire    Fear of Current or Ex-Partner: No    Emotionally Abused: No    Physically Abused: No    Sexually Abused: No   Past Surgical History:  Procedure Laterality Date   COLONOSCOPY  2016   HAND SURGERY     Left    NECK SURGERY     RHINOPLASTY  SPINAL FUSION     Family History  Problem Relation Age of Onset   Drug abuse Mother    Cancer Mother        lung   Alzheimer's disease Mother    Dementia Sister    Colon cancer Neg Hx    Rectal cancer Neg Hx    Stomach cancer Neg Hx    Colon polyps Neg Hx    Esophageal cancer Neg Hx     Current Outpatient Medications:    calcium-vitamin D (OSCAL WITH D) 500-200 MG-UNIT tablet, Take 1 tablet by mouth daily., Disp: , Rfl:    Ferrous Sulfate (IRON) 325 (65 FE) MG TABS, Take 1 tablet by mouth daily., Disp: , Rfl:    methocarbamol (ROBAXIN) 500 MG tablet, TAKE 1 TABLET BY MOUTH EVERY 6 HOURS AS NEEDED FOR MUSCLE SPASMS., Disp: 120 tablet, Rfl: PRN   Multiple Vitamins-Minerals (CENTRUM SILVER PO),  Take 1 capsule by mouth daily., Disp: , Rfl:    tiZANidine (ZANAFLEX) 4 MG tablet, Take 1 tablet (4 mg total) by mouth every 8 (eight) hours as needed for muscle spasms. For breakthrough symptoms not controlled by robaxin, Disp: 20 tablet, Rfl: 0   citalopram (CELEXA) 20 MG tablet, Take 1 tablet (20 mg total) by mouth daily., Disp: 90 tablet, Rfl: 4   rosuvastatin (CRESTOR) 10 MG tablet, Take 1 tablet (10 mg total) by mouth daily., Disp: 90 tablet, Rfl: 4  No Known Allergies   ROS: Review of Systems A comprehensive review of systems was negative except for: Integument/breast: positive for skin lesion(s) Musculoskeletal: positive for neck pain and stiff joints    Physical exam BP 123/76   Pulse 78   Temp 98.7 F (37.1 C)   Wt 182 lb (82.6 kg)   SpO2 96%   BMI 26.11 kg/m  General appearance: alert, cooperative, appears stated age, and no distress Head: Normocephalic, without obvious abnormality, atraumatic Eyes: negative findings: lids and lashes normal, conjunctivae and sclerae normal, corneas clear, and pupils equal, round, reactive to light and accomodation Ears: normal TM's and external ear canals both ears Nose: Nares normal. Septum midline. Mucosa normal. No drainage or sinus tenderness. Throat: lips, mucosa, and tongue normal; teeth and gums normal Neck: no adenopathy, supple, symmetrical, trachea midline, and thyroid not enlarged, symmetric, no tenderness/mass/nodules Back: symmetric, no curvature. ROM normal. No CVA tenderness. Lungs: clear to auscultation bilaterally Chest wall: no tenderness Heart: regular rate and rhythm, S1, S2 normal, no murmur, click, rub or gallop Abdomen: soft, non-tender; bowel sounds normal; no masses,  no organomegaly Extremities: extremities normal, atraumatic, no cyanosis or edema Pulses: 2+ and symmetric Skin:  pigmented nevi on forearms. Lesion of concern is cystic and has a hair growing out of it. <56mm in circumference. Lymph nodes:  Cervical, supraclavicular, and axillary nodes normal. Neurologic: Grossly normal      08/26/2023    1:20 PM 09/08/2022    9:06 AM 07/04/2022   11:19 AM  Depression screen PHQ 2/9  Decreased Interest 0 0 0  Down, Depressed, Hopeless 0 0 0  PHQ - 2 Score 0 0 0  Altered sleeping 0 0 0  Tired, decreased energy 0 0 0  Change in appetite 0 0 0  Feeling bad or failure about yourself  0 0 0  Trouble concentrating 0 0 0  Moving slowly or fidgety/restless 0 0 0  Suicidal thoughts 0 0 0  PHQ-9 Score 0 0 0  Difficult doing work/chores Not difficult at all Not difficult  at all Not difficult at all      08/26/2023    1:20 PM 09/08/2022    9:06 AM 07/04/2022   11:20 AM 08/21/2021    3:58 PM  GAD 7 : Generalized Anxiety Score  Nervous, Anxious, on Edge 0 0 0 1  Control/stop worrying 0 0 0 0  Worry too much - different things 0 0 0 0  Trouble relaxing 0 0 0 1  Restless 0 0 0 0  Easily annoyed or irritable 0 0 0 0  Afraid - awful might happen 0 0 0 0  Total GAD 7 Score 0 0 0 2  Anxiety Difficulty Not difficult at all  Not difficult at all      Assessment/ Plan: Joseph Oliver here for annual physical exam.   Annual physical exam  Need for shingles vaccine  Generalized anxiety disorder - Plan: CMP14+EGFR, citalopram (CELEXA) 20 MG tablet  Other hyperlipidemia - Plan: CMP14+EGFR, TSH, rosuvastatin (CRESTOR) 10 MG tablet  Vitamin D deficiency - Plan: VITAMIN D 25 Hydroxy (Vit-D Deficiency, Fractures)  Prediabetes - Plan: CMP14+EGFR, Bayer DCA Hb A1c Waived  Screening for malignant neoplasm of prostate - Plan: PSA  Other iron deficiency anemia - Plan: CBC, Iron, TIBC and Ferritin Panel  Cervicalgia - Plan: tiZANidine (ZANAFLEX) 4 MG tablet  Second shingrix given.  Meds renewed. Non fasting labs collected. All chronic issues are stable.  Counseled on healthy lifestyle choices, including diet (rich in fruits, vegetables and lean meats and low in salt and simple carbohydrates) and  exercise (at least 30 minutes of moderate physical activity daily).  Patient to follow up 1 year for CPE  Joseph Emond M. Nadine Counts, DO

## 2023-08-26 NOTE — Patient Instructions (Signed)

## 2023-08-27 LAB — CMP14+EGFR
ALT: 30 IU/L (ref 0–44)
AST: 27 IU/L (ref 0–40)
Albumin: 4.3 g/dL (ref 3.9–4.9)
Alkaline Phosphatase: 71 IU/L (ref 44–121)
BUN/Creatinine Ratio: 37 — ABNORMAL HIGH (ref 10–24)
BUN: 31 mg/dL — ABNORMAL HIGH (ref 8–27)
Bilirubin Total: 0.3 mg/dL (ref 0.0–1.2)
CO2: 24 mmol/L (ref 20–29)
Calcium: 9.1 mg/dL (ref 8.6–10.2)
Chloride: 103 mmol/L (ref 96–106)
Creatinine, Ser: 0.83 mg/dL (ref 0.76–1.27)
Globulin, Total: 2.3 g/dL (ref 1.5–4.5)
Glucose: 88 mg/dL (ref 70–99)
Potassium: 4.1 mmol/L (ref 3.5–5.2)
Sodium: 140 mmol/L (ref 134–144)
Total Protein: 6.6 g/dL (ref 6.0–8.5)
eGFR: 98 mL/min/{1.73_m2} (ref >59)

## 2023-08-27 LAB — CBC
Hematocrit: 42.6 % (ref 37.5–51.0)
Hemoglobin: 13.9 g/dL (ref 13.0–17.7)
MCH: 29 pg (ref 26.6–33.0)
MCHC: 32.6 g/dL (ref 31.5–35.7)
MCV: 89 fL (ref 79–97)
Platelets: 276 10*3/uL (ref 150–450)
RBC: 4.8 x10E6/uL (ref 4.14–5.80)
RDW: 12.7 % (ref 11.6–15.4)
WBC: 10 10*3/uL (ref 3.4–10.8)

## 2023-08-27 LAB — TSH: TSH: 1.73 u[IU]/mL (ref 0.450–4.500)

## 2023-08-27 LAB — IRON,TIBC AND FERRITIN PANEL
Ferritin: 346 ng/mL (ref 30–400)
Iron Saturation: 25 % (ref 15–55)
Iron: 63 ug/dL (ref 38–169)
Total Iron Binding Capacity: 257 ug/dL (ref 250–450)
UIBC: 194 ug/dL (ref 111–343)

## 2023-08-27 LAB — PSA: Prostate Specific Ag, Serum: 0.2 ng/mL (ref 0.0–4.0)

## 2023-08-27 LAB — VITAMIN D 25 HYDROXY (VIT D DEFICIENCY, FRACTURES): Vit D, 25-Hydroxy: 44.4 ng/mL (ref 30.0–100.0)

## 2023-10-01 ENCOUNTER — Telehealth: Payer: Self-pay

## 2023-10-01 ENCOUNTER — Telehealth (INDEPENDENT_AMBULATORY_CARE_PROVIDER_SITE_OTHER): Admitting: Family Medicine

## 2023-10-01 ENCOUNTER — Encounter: Payer: Self-pay | Admitting: Family Medicine

## 2023-10-01 DIAGNOSIS — R051 Acute cough: Secondary | ICD-10-CM

## 2023-10-01 MED ORDER — HYDROCODONE BIT-HOMATROP MBR 5-1.5 MG/5ML PO SOLN: 5.0000 mL | Freq: Three times a day (TID) | ORAL | 0 refills | Status: AC | PRN

## 2023-10-01 NOTE — Progress Notes (Signed)
 Virtual Visit via Video Note  I connected with Joseph Oliver on 10/01/23 at  4:45 PM EDT by a video enabled telemedicine application and verified that I am speaking with the correct person using two identifiers.  Patient Location: Other:  in [place]  Provider Location: Office/Clinic  I discussed the limitations, risks, security, and privacy concerns of performing an evaluation and management service by video and the availability of in person appointments. I also discussed with the patient that there may be a patient responsible charge related to this service. The patient expressed understanding and agreed to proceed.  Subjective: PCP: Raliegh Ip, DO  Chief Complaint  Patient presents with   Cough   Cough   States that he has cough that is making him unable to sleep. He also reports sore throat that started 2-3 nights ago and chest congestion. Coughing fits with sputum production. He has cough syrup that he would like to refill. Denies N/V, diarrhea.  States that he has rhinorrhea, sneezing, scratchy throat.   ROS: Per HPI  Current Outpatient Medications:    calcium-vitamin D (OSCAL WITH D) 500-200 MG-UNIT tablet, Take 1 tablet by mouth daily., Disp: , Rfl:    citalopram (CELEXA) 20 MG tablet, Take 1 tablet (20 mg total) by mouth daily., Disp: 90 tablet, Rfl: 4   Ferrous Sulfate (IRON) 325 (65 FE) MG TABS, Take 1 tablet by mouth daily., Disp: , Rfl:    methocarbamol (ROBAXIN) 500 MG tablet, TAKE 1 TABLET BY MOUTH EVERY 6 HOURS AS NEEDED FOR MUSCLE SPASMS., Disp: 120 tablet, Rfl: PRN   Multiple Vitamins-Minerals (CENTRUM SILVER PO), Take 1 capsule by mouth daily., Disp: , Rfl:    rosuvastatin (CRESTOR) 10 MG tablet, Take 1 tablet (10 mg total) by mouth daily., Disp: 90 tablet, Rfl: 4   tiZANidine (ZANAFLEX) 4 MG tablet, Take 1 tablet (4 mg total) by mouth every 8 (eight) hours as needed for muscle spasms. For breakthrough symptoms not controlled by robaxin, Disp: 20 tablet,  Rfl: 0  Observations/Objective: There were no vitals filed for this visit. Physical Exam Constitutional:      General: He is awake. He is not in acute distress.    Appearance: Normal appearance. He is well-developed and well-groomed. He is not ill-appearing, toxic-appearing or diaphoretic.  HENT:     Nose: Congestion present.  Pulmonary:     Effort: Pulmonary effort is normal.     Comments: Cough present  Neurological:     General: No focal deficit present.     Mental Status: He is alert and oriented to person, place, and time.  Psychiatric:        Attention and Perception: Attention and perception normal.        Mood and Affect: Mood and affect normal.        Speech: Speech normal.        Behavior: Behavior normal. Behavior is cooperative.        Thought Content: Thought content normal.        Cognition and Memory: Cognition and memory normal.        Judgment: Judgment normal.    Assessment and Plan: 1. Acute cough (Primary) Discussed with patient that likely viral in etiology. Discussed that symptoms can last for up to 2 weeks. Encouraged patient to follow up if they have worsening symptoms or signs of double worsening. Discussed at home care such as humidifier, saline spray, throat lozenges, increasing hydration, and tylenol for pain or fever.  Will  send in medication as below to assist with symptoms. Discussed with patient to monitor for side effects of drowsiness with use of muscle relaxers.  - HYDROcodone bit-homatropine (HYCODAN) 5-1.5 MG/5ML syrup; Take 5 mLs by mouth every 8 (eight) hours as needed for cough.  Dispense: 120 mL; Refill: 0   Appt time 10/01/2023 04:45 PM Call duration: 00:09:59  Follow Up Instructions: Return if symptoms worsen or fail to improve.   I discussed the assessment and treatment plan with the patient. The patient was provided an opportunity to ask questions, and all were answered. The patient agreed with the plan and demonstrated an  understanding of the instructions.   The patient was advised to call back or seek an in-person evaluation if the symptoms worsen or if the condition fails to improve as anticipated.  The above assessment and management plan was discussed with the patient. The patient verbalized understanding of and has agreed to the management plan.   Jacqualyn Mates, DNP-FNP [facility] [address] [place], [place] [address] [phone]

## 2023-10-01 NOTE — Telephone Encounter (Signed)
Attempted to contact patient with no answer.

## 2023-10-01 NOTE — Telephone Encounter (Signed)
 Copied from CRM [id number]. Topic: Appointments - Scheduling Inquiry for Clinic >> Oct 01, 2023 12:17 PM DeAngela L wrote: Reason for CRM: Patient states he is having really bad chest congestion and also coughing  started 2 day ago and would like an appointment with another provider in the building since his pcp doesn't have appointments till June Patient [phone] (M)    Desman came in hollering at [place] in the lobby, demanding to "see someone for a refill". Eveleen Hinds came into triage and asked me if I would speak to [facility], I called him into triage and he was immediately irate. He was waving around an 63 year old prescription of hydrocodone  cough syrup, yelling wanting to know why no one had returned his call. I tried to explain to Britten that we had been short staffed all day and were working on returning patient calls as quickly as we possibly could. Joedy did not care what I had to say, he demanded to be seen by any one that would see him. I offered him a video visit with Connell Degree, as she was the doctor of the day, and Ennio became even more upset saying "so you mean to tell me that I sat around all damn day waiting on a phone call for an appointment and now that I'm here I can't see the provider face to face, she isn't even on the fucking premises" "this place is a fucking joke, kept me waiting around all damn day when I had shit to do" "I just want this couch medicine filled so that I can sleep tonight" "I guess I will have to go home and take my percocet that is 63 years old so that I can get some sleep since no one here is willing to help me" I made sure to tell him that I was trying my best to help him and find a resolution for him and that Connell Degree was here on the premises but since it was after 4:30 and he did not have an appointment, I could put him on the schedule for a video visit. I ended up having to ask Alexandro Angelica to come into triage to assist me with Isahia because at this  point he was so unhappy. When Clayton came into the room he was the same way with her, very rude and unhappy. We tried to reassure him that we understood his frustration but he was just not having it. Finally we talked him into doing the video visit, but he sat in triage for 15 minutes waiting for his appointment to start and the whole time just talked about how much of a "joke the practice is for not following up with their patients". Once the video visit started he went into the lobby for his appointment and that was the end of my encounter with Ethelle Herb.

## 2023-10-05 NOTE — Telephone Encounter (Signed)
 I went in when Joseph Oliver asked the patient was very loud and rude nothing we offered was good enough. He also stated that his wellness visits were only 10 minutes long. So why was there no one here to listen to his lungs for 2 minuets. He just kept being belligerent toward the both of us . He was completley out of line.

## 2023-10-06 NOTE — Telephone Encounter (Signed)
 This is totally inappropriate behavior.  Abuse to staff is NOT appropriate and will not be tolerated.  Connie Deist, please draw up a letter of dismissal and please attach a copy of alternative care centers for this patient.

## 2023-10-07 ENCOUNTER — Encounter: Payer: Self-pay | Admitting: Family Medicine

## 2023-10-07 NOTE — Telephone Encounter (Signed)
 Dismissal letter written and mailed

## 2024-01-03 ENCOUNTER — Other Ambulatory Visit: Payer: Self-pay | Admitting: Family Medicine

## 2024-01-03 DIAGNOSIS — M542 Cervicalgia: Secondary | ICD-10-CM

## 2024-05-04 DIAGNOSIS — H35372 Puckering of macula, left eye: Secondary | ICD-10-CM | POA: Diagnosis not present

## 2024-05-04 DIAGNOSIS — H40053 Ocular hypertension, bilateral: Secondary | ICD-10-CM | POA: Diagnosis not present

## 2024-05-04 DIAGNOSIS — H5203 Hypermetropia, bilateral: Secondary | ICD-10-CM | POA: Diagnosis not present

## 2024-05-04 DIAGNOSIS — H2513 Age-related nuclear cataract, bilateral: Secondary | ICD-10-CM | POA: Diagnosis not present

## 2024-06-11 ENCOUNTER — Other Ambulatory Visit: Payer: Self-pay | Admitting: Family Medicine

## 2024-06-11 DIAGNOSIS — M542 Cervicalgia: Secondary | ICD-10-CM

## 2024-08-29 ENCOUNTER — Encounter: Admitting: Family Medicine
